# Patient Record
Sex: Female | Born: 1935 | Race: Black or African American | Hispanic: No | Smoking: Never smoker
Health system: Southern US, Community
[De-identification: ages and names within clinical notes are randomized; demographics above are authoritative.]

## PROBLEM LIST (undated history)

## (undated) DIAGNOSIS — F419 Anxiety disorder, unspecified: Secondary | ICD-10-CM

## (undated) DIAGNOSIS — N189 Chronic kidney disease, unspecified: Secondary | ICD-10-CM

## (undated) DIAGNOSIS — I10 Essential (primary) hypertension: Secondary | ICD-10-CM

## (undated) DIAGNOSIS — G4700 Insomnia, unspecified: Secondary | ICD-10-CM

## (undated) DIAGNOSIS — M1990 Unspecified osteoarthritis, unspecified site: Secondary | ICD-10-CM

## (undated) DIAGNOSIS — E7800 Pure hypercholesterolemia, unspecified: Secondary | ICD-10-CM

## (undated) DIAGNOSIS — G709 Myoneural disorder, unspecified: Secondary | ICD-10-CM

## (undated) DIAGNOSIS — K219 Gastro-esophageal reflux disease without esophagitis: Secondary | ICD-10-CM

## (undated) HISTORY — PX: ABDOMINAL HYSTERECTOMY: SHX81

---

## 2017-01-20 ENCOUNTER — Encounter (INDEPENDENT_AMBULATORY_CARE_PROVIDER_SITE_OTHER): Payer: Medicare Other | Admitting: Ophthalmology

## 2017-01-20 DIAGNOSIS — I10 Essential (primary) hypertension: Secondary | ICD-10-CM | POA: Diagnosis not present

## 2017-01-20 DIAGNOSIS — H35033 Hypertensive retinopathy, bilateral: Secondary | ICD-10-CM | POA: Diagnosis not present

## 2017-01-20 DIAGNOSIS — H353132 Nonexudative age-related macular degeneration, bilateral, intermediate dry stage: Secondary | ICD-10-CM

## 2017-01-20 DIAGNOSIS — H34832 Tributary (branch) retinal vein occlusion, left eye, with macular edema: Secondary | ICD-10-CM

## 2017-02-17 ENCOUNTER — Encounter (INDEPENDENT_AMBULATORY_CARE_PROVIDER_SITE_OTHER): Payer: Medicare Other | Admitting: Ophthalmology

## 2017-02-21 ENCOUNTER — Encounter (INDEPENDENT_AMBULATORY_CARE_PROVIDER_SITE_OTHER): Payer: Medicare Other | Admitting: Ophthalmology

## 2017-02-21 DIAGNOSIS — H353112 Nonexudative age-related macular degeneration, right eye, intermediate dry stage: Secondary | ICD-10-CM | POA: Diagnosis not present

## 2017-02-21 DIAGNOSIS — H353221 Exudative age-related macular degeneration, left eye, with active choroidal neovascularization: Secondary | ICD-10-CM

## 2017-02-21 DIAGNOSIS — I10 Essential (primary) hypertension: Secondary | ICD-10-CM | POA: Diagnosis not present

## 2017-02-21 DIAGNOSIS — H35033 Hypertensive retinopathy, bilateral: Secondary | ICD-10-CM

## 2017-02-21 DIAGNOSIS — H43813 Vitreous degeneration, bilateral: Secondary | ICD-10-CM | POA: Diagnosis not present

## 2017-03-21 ENCOUNTER — Encounter (INDEPENDENT_AMBULATORY_CARE_PROVIDER_SITE_OTHER): Payer: Medicare Other | Admitting: Ophthalmology

## 2017-03-21 DIAGNOSIS — H353221 Exudative age-related macular degeneration, left eye, with active choroidal neovascularization: Secondary | ICD-10-CM | POA: Diagnosis not present

## 2017-03-21 DIAGNOSIS — I10 Essential (primary) hypertension: Secondary | ICD-10-CM | POA: Diagnosis not present

## 2017-03-21 DIAGNOSIS — H353112 Nonexudative age-related macular degeneration, right eye, intermediate dry stage: Secondary | ICD-10-CM

## 2017-03-21 DIAGNOSIS — H35033 Hypertensive retinopathy, bilateral: Secondary | ICD-10-CM

## 2017-03-21 DIAGNOSIS — H34832 Tributary (branch) retinal vein occlusion, left eye, with macular edema: Secondary | ICD-10-CM | POA: Diagnosis not present

## 2017-03-21 DIAGNOSIS — H43813 Vitreous degeneration, bilateral: Secondary | ICD-10-CM

## 2017-04-17 ENCOUNTER — Encounter (INDEPENDENT_AMBULATORY_CARE_PROVIDER_SITE_OTHER): Payer: Medicare Other | Admitting: Ophthalmology

## 2017-04-17 DIAGNOSIS — H353221 Exudative age-related macular degeneration, left eye, with active choroidal neovascularization: Secondary | ICD-10-CM | POA: Diagnosis not present

## 2017-04-17 DIAGNOSIS — H43813 Vitreous degeneration, bilateral: Secondary | ICD-10-CM | POA: Diagnosis not present

## 2017-04-17 DIAGNOSIS — H34832 Tributary (branch) retinal vein occlusion, left eye, with macular edema: Secondary | ICD-10-CM | POA: Diagnosis not present

## 2017-04-17 DIAGNOSIS — H35033 Hypertensive retinopathy, bilateral: Secondary | ICD-10-CM | POA: Diagnosis not present

## 2017-04-17 DIAGNOSIS — H353112 Nonexudative age-related macular degeneration, right eye, intermediate dry stage: Secondary | ICD-10-CM

## 2017-04-17 DIAGNOSIS — I10 Essential (primary) hypertension: Secondary | ICD-10-CM

## 2017-05-29 ENCOUNTER — Encounter (INDEPENDENT_AMBULATORY_CARE_PROVIDER_SITE_OTHER): Payer: Medicare Other | Admitting: Ophthalmology

## 2017-05-29 DIAGNOSIS — H353111 Nonexudative age-related macular degeneration, right eye, early dry stage: Secondary | ICD-10-CM

## 2017-05-29 DIAGNOSIS — H353221 Exudative age-related macular degeneration, left eye, with active choroidal neovascularization: Secondary | ICD-10-CM | POA: Diagnosis not present

## 2017-05-29 DIAGNOSIS — H35033 Hypertensive retinopathy, bilateral: Secondary | ICD-10-CM

## 2017-05-29 DIAGNOSIS — I10 Essential (primary) hypertension: Secondary | ICD-10-CM | POA: Diagnosis not present

## 2017-05-29 DIAGNOSIS — H43813 Vitreous degeneration, bilateral: Secondary | ICD-10-CM | POA: Diagnosis not present

## 2017-07-17 ENCOUNTER — Encounter (INDEPENDENT_AMBULATORY_CARE_PROVIDER_SITE_OTHER): Payer: Medicare Other | Admitting: Ophthalmology

## 2017-07-17 DIAGNOSIS — I10 Essential (primary) hypertension: Secondary | ICD-10-CM | POA: Diagnosis not present

## 2017-07-17 DIAGNOSIS — H35033 Hypertensive retinopathy, bilateral: Secondary | ICD-10-CM | POA: Diagnosis not present

## 2017-07-17 DIAGNOSIS — H353221 Exudative age-related macular degeneration, left eye, with active choroidal neovascularization: Secondary | ICD-10-CM | POA: Diagnosis not present

## 2017-07-17 DIAGNOSIS — H43813 Vitreous degeneration, bilateral: Secondary | ICD-10-CM

## 2017-07-17 DIAGNOSIS — H353112 Nonexudative age-related macular degeneration, right eye, intermediate dry stage: Secondary | ICD-10-CM

## 2017-09-18 ENCOUNTER — Encounter (INDEPENDENT_AMBULATORY_CARE_PROVIDER_SITE_OTHER): Payer: Medicare Other | Admitting: Ophthalmology

## 2017-09-18 DIAGNOSIS — H35033 Hypertensive retinopathy, bilateral: Secondary | ICD-10-CM | POA: Diagnosis not present

## 2017-09-18 DIAGNOSIS — H43813 Vitreous degeneration, bilateral: Secondary | ICD-10-CM | POA: Diagnosis not present

## 2017-09-18 DIAGNOSIS — I10 Essential (primary) hypertension: Secondary | ICD-10-CM | POA: Diagnosis not present

## 2017-09-18 DIAGNOSIS — H353112 Nonexudative age-related macular degeneration, right eye, intermediate dry stage: Secondary | ICD-10-CM

## 2017-09-18 DIAGNOSIS — H353221 Exudative age-related macular degeneration, left eye, with active choroidal neovascularization: Secondary | ICD-10-CM

## 2017-11-12 ENCOUNTER — Other Ambulatory Visit: Payer: Self-pay | Admitting: Internal Medicine

## 2017-11-12 DIAGNOSIS — Z1231 Encounter for screening mammogram for malignant neoplasm of breast: Secondary | ICD-10-CM

## 2017-11-20 ENCOUNTER — Encounter (INDEPENDENT_AMBULATORY_CARE_PROVIDER_SITE_OTHER): Payer: Medicare Other | Admitting: Ophthalmology

## 2017-12-12 ENCOUNTER — Ambulatory Visit: Payer: Self-pay

## 2017-12-16 ENCOUNTER — Other Ambulatory Visit: Payer: Self-pay | Admitting: Gastroenterology

## 2017-12-18 ENCOUNTER — Encounter (INDEPENDENT_AMBULATORY_CARE_PROVIDER_SITE_OTHER): Payer: Medicare Other | Admitting: Ophthalmology

## 2017-12-18 DIAGNOSIS — H43813 Vitreous degeneration, bilateral: Secondary | ICD-10-CM

## 2017-12-18 DIAGNOSIS — H353221 Exudative age-related macular degeneration, left eye, with active choroidal neovascularization: Secondary | ICD-10-CM | POA: Diagnosis not present

## 2017-12-18 DIAGNOSIS — H35033 Hypertensive retinopathy, bilateral: Secondary | ICD-10-CM | POA: Diagnosis not present

## 2017-12-18 DIAGNOSIS — H353112 Nonexudative age-related macular degeneration, right eye, intermediate dry stage: Secondary | ICD-10-CM | POA: Diagnosis not present

## 2017-12-18 DIAGNOSIS — I10 Essential (primary) hypertension: Secondary | ICD-10-CM

## 2017-12-22 ENCOUNTER — Other Ambulatory Visit: Payer: Self-pay

## 2017-12-22 ENCOUNTER — Encounter (HOSPITAL_COMMUNITY): Payer: Self-pay | Admitting: Emergency Medicine

## 2017-12-30 ENCOUNTER — Other Ambulatory Visit (HOSPITAL_COMMUNITY): Payer: Self-pay | Admitting: Orthopedic Surgery

## 2017-12-30 DIAGNOSIS — Z96651 Presence of right artificial knee joint: Secondary | ICD-10-CM

## 2017-12-30 NOTE — H&P (Signed)
History of Present Illness  General:  84 year old female was referred for uncontrolled acid reflux. She has had acid reflux for over 10 years and reports an endoscopy performed in [place] in [place], was told she had irritation in the stomach. Currently she Dexilant 60 milligrams a day, carafate one gram BID and Zantac 75 milligrams at bedtime. Despite these medication she continues to have acid reflux throughout the day, which she describes as heartburn and reflux of food contents from the stomach into esophagus and mouth. She denies difficulty swallowing or pain on swallowing. She does however complain of early satiety and feeling full after 2 to 3 small bites. She is also lost about 10 pounds in the last one month. She feels more constipated than normal and has a bowel movement every 2 to 3 days. She will take fiber pills up to 8 at night as needed. She denies blood in stool or black stools. She reports having a colonoscopy performed in the last 10 years and tells me that it was normal. Patient drinks one cup of coffee a day, denies smoking, drinks red wine occasionally wonder 2 times a week. Patient denies use of NSAIDs. Patient denies eating late-night.   Current Medications  Taking   PreserVision AREDS - Tablet 2 tablets Orally once a day   Cod Liver Oil - Capsule 1 capsule Orally once a day   Centrum Silver - Tablet 1 tablet Orally once a day   Vitamin D3 1000 UNIT Tablet 1 tablet Orally Once a day   Vitamin B12 100 MCG Tablet 2 tablets Orally Once a day   Hydrocortisone 1 % Cream 1 application to affected area Rectal Twice a day   Metoprolol Succinate ER 50 MG Tablet Extended Release 24 Hour 1 tablet Orally Once a day   Potassium Chloride ER 10 MEQ Tablet Extended Release 1 tablet Orally Once a day   Triamterene-HCTZ 37.5-25 MG Capsule 1 capsule in the morning Orally Once a day   Sucralfate 1 GM Tablet 1 tablet at bedtime on an empty stomach before meals Orally Twice a day    Dexilant(Dexlansoprazole) 60 MG Capsule Delayed Release 1 capsule Orally Once a day   Zantac 75(RaNITidine HCl) 75 MG Tablet 1 tablet as needed Orally at bedtime   Temazepam 15 MG Capsule 1 capsule at bedtime as needed Orally Once a day   Rosuvastatin Calcium 10 MG Tablet 1 tablet Orally Once a day   Voltaren(Diclofenac Sodium (Ophth)) 1 % Gel apply sparringly Transdermal four times a day as needed   Medication List reviewed and reconciled with the patient    Past Medical History  Essential hypertension.   gastroesophageal reflux disease, status post EGD November 2011, mild antral gastritis, PUD 1989.   Anxiety, insomnia.   Cervical spinal stenosis.   osteoarthritis of the hand, compression of ulnar/median nerve Dr. Marlana LatusPilch.   dyslipidemia, patient has tried atorvastatin, latest rosuvastatin, TOTAL cholesterol greater than 300.   Opth- Dr. Ashley RoyaltyMatthews, Dentist Dr. Arby BarretteHatchett.   Osteopenia- BMD- 2018.   macular degeneration left eye- Avastin injections.    Surgical History  [facility] [facility]  [place], [place] Dr. Grayland JackMelanson 1999  left knee replacement surgery, [place] 2000  [facility] [place] [facility]  total abdominal hysterectomy with BIlateral salpingo-oophorectomy 1978  right breast biopsy, benign 04/2005   Family History  Father: deceased  Mother: deceased, diagnosed with Colon cancer  2 son(s) , 2 daughter(s) .  one child has diabetes Mother had colon cancer. Neg family hx of liver disease.   Social History  General:  Tobacco use  cigarettes: Never smoked Tobacco history last updated 11/12/2017 no EXPOSURE TO PASSIVE SMOKE.  no Alcohol.  no Caffeine.  no Recreational drug use.  DIET: good.  no Exercise.  DENTAL CARE: good.  Marital Status: single, live with daughter .  Children: girls, 2, Boys, 2.  EDUCATION: HS.  COMMUNICATION BARRIERS: hearing loss.     Allergies  Penicillin: rash - Allergy  Aspirin: rash - Allergy  Lisinopril: swelling of lips, rash inside of mouth - Allergy   Hospitalization/Major Diagnostic Procedure  None in the last year 12/2017   Review of Systems  GI PROCEDURE:  no Pacemaker/ AICD, no. no Artificial heart valves. no MI/heart attack. no Abnormal heart rhythm. no Angina. no CVA. Hypertension YES. no Hypotension. no Asthma, COPD. no Sleep apnea. no Seizure disorders. Artificial joints YES. Severe DJD YES. no Diabetes. no Significant headaches. no Vertigo. Depression/anxiety YES, anxiety. no Abnormal bleeding. no Kidney Disease. no Liver disease, no. no Chance of pregnancy. Blood transfusion yes. no Method of Birth Control. no Birth control pills.      Vital Signs  Wt 135.9, Wt change 3.7 lb, Ht 59.75, BMI 26.76, Temp 97.5, BP sitting 148/100.   Examination  Gastroenterology:: GENERAL APPEARANCE: frail appearing thinly built, appears hers stated age, no active distress,pleasant, no acute distress.  EYES: Lids and conjunctiva normal. Sclera normal, pupils equal and reactive .  ORAL CAVITY: Lips, teeth and gums are normal. Pharynx, tongue, mucosa normal .  SCLERA: anicteric .  NECK Full ROM, trachea midline, no thyromegaly or masses .  CARDIOVASCULAR PMI LS border. Normal RRR w/o murmers or gallops. No peripheral edema .  RESPIRATORY Breath sounds normal. Respiration even and unlabored .  ABDOMEN No masses palpated. Liver and spleen not palpated, normal. Bowel sounds normal, Abdomen not distended .  EXTREMITIES: No edema, pulses intact .  NEURO: normal strength and reflexes, cranial nerves II-XII grossly intact, normal gait .  PSYCH: mood/affect normal .     Assessments   1. Early satiety - R68.81 (Primary)   2. Epigastric pain - R10.13   3. Weight loss - R63.4   4. Chronic GERD - K21.9   Treatment  1. Early satiety  IMAGING: Esophagoscopy    Whitfield,Dia 12/16/2017 10:14:31 AM > spoke with  Kendall-scheduled for 01/01/18 at 8:30am at [facility]-prep instructions reviewed with pt.   Notes: Will evaluate further with an endoscopy. We take biopsies from the antrum to rule out H. pylori and small bowel to let celiac disease. Malignancy is a concern as patient also has unintentional weight loss of 10 pounds in the last one month.  Referral To: Reason:EGD w/propofol-spoke with Kendall-[facility]-#[id number]    2. Epigastric pain  IMAGING: Esophagoscopy    Whitfield,Dia 12/16/2017 10:14:31 AM > spoke with Kendall-scheduled for 01/01/18 at 8:30am at [facility]-prep instructions reviewed with pt.   Notes: Differential diagnosis includes H. pylori gastritis, peptic ulcer disease, biliary gastritis.    3. Weight loss  IMAGING: Esophagoscopy    Whitfield,Dia 12/16/2017 10:14:31 AM > spoke with Kendall-scheduled for 01/01/18 at 8:30am at [facility]-prep instructions reviewed with pt.   Notes: This is concerning and UGI malignancy needs to be ruled out. Patient also has uncontrolled acid reflux for over 10 years and Barrett's esophagus needs to be considered.    4. Chronic GERD  IMAGING: Esophagoscopy    Whitfield,Dia 12/16/2017 10:14:31 AM > spoke with Kendall-scheduled  for 01/01/18 at 8:30am at [facility]-prep instructions reviewed with pt.   Notes: Patient is on PPI /Dexilant and along with use of H2 antihistamine/Zantac at night, she also takes carafate twice a day. Patient does not eat late-night, I have however advised patient to avoid caffeinated products and to avoid or limit use of alcohol   Kerin Salen, MD

## 2017-12-31 NOTE — Anesthesia Preprocedure Evaluation (Addendum)
Anesthesia Evaluation  Patient identified by MRN, date of birth, ID band Patient awake    Reviewed: Allergy & Precautions, NPO status , Patient's Chart, lab work & pertinent test results, reviewed documented beta blocker date and time   Airway Mallampati: I  TM Distance: >3 FB Neck ROM: Full    Dental  (+) Dental Advisory Given, Edentulous Upper   Pulmonary neg pulmonary ROS, neg sleep apnea, neg COPD,    Pulmonary exam normal breath sounds clear to auscultation       Cardiovascular hypertension, Pt. on medications and Pt. on home beta blockers (-) Past MI Normal cardiovascular exam Rhythm:Regular Rate:Normal     Neuro/Psych Anxiety negative neurological ROS     GI/Hepatic Neg liver ROS, GERD  Medicated and Controlled,  Endo/Other  negative endocrine ROS  Renal/GU CRFRenal disease  negative genitourinary   Musculoskeletal  (+) Arthritis ,   Abdominal   Peds  Hematology negative hematology ROS (+)   Anesthesia Other Findings   Reproductive/Obstetrics                            Anesthesia Physical Anesthesia Plan  ASA: II  Anesthesia Plan: MAC   Post-op Pain Management:    Induction: Intravenous  PONV Risk Score and Plan: Propofol infusion and Treatment may vary due to age or medical condition  Airway Management Planned: Nasal Cannula  Additional Equipment: None  Intra-op Plan:   Post-operative Plan:   Informed Consent: I have reviewed the patients History and Physical, chart, labs and discussed the procedure including the risks, benefits and alternatives for the proposed anesthesia with the patient or authorized representative who has indicated his/her understanding and acceptance.   Dental advisory given  Plan Discussed with: CRNA  Anesthesia Plan Comments:         Anesthesia Quick Evaluation

## 2018-01-01 ENCOUNTER — Encounter (HOSPITAL_COMMUNITY)
Admission: RE | Disposition: A | Discharge status: Home / Self Care | Payer: Self-pay | Source: Ambulatory Visit | Attending: Gastroenterology

## 2018-01-01 ENCOUNTER — Ambulatory Visit (HOSPITAL_COMMUNITY): Payer: Medicare Other | Admitting: Certified Registered Nurse Anesthetist

## 2018-01-01 ENCOUNTER — Other Ambulatory Visit: Payer: Self-pay

## 2018-01-01 ENCOUNTER — Ambulatory Visit (HOSPITAL_COMMUNITY)
Admission: RE | Admit: 2018-01-01 | Discharge: 2018-01-01 | Disposition: A | Discharge status: Home / Self Care | Payer: Medicare Other | Source: Ambulatory Visit | Attending: Gastroenterology | Admitting: Gastroenterology

## 2018-01-01 ENCOUNTER — Encounter (HOSPITAL_COMMUNITY): Payer: Self-pay | Admitting: Certified Registered Nurse Anesthetist

## 2018-01-01 DIAGNOSIS — R6881 Early satiety: Secondary | ICD-10-CM | POA: Diagnosis not present

## 2018-01-01 DIAGNOSIS — M1990 Unspecified osteoarthritis, unspecified site: Secondary | ICD-10-CM | POA: Diagnosis not present

## 2018-01-01 DIAGNOSIS — F419 Anxiety disorder, unspecified: Secondary | ICD-10-CM | POA: Insufficient documentation

## 2018-01-01 DIAGNOSIS — Z8719 Personal history of other diseases of the digestive system: Secondary | ICD-10-CM | POA: Insufficient documentation

## 2018-01-01 DIAGNOSIS — R634 Abnormal weight loss: Secondary | ICD-10-CM | POA: Diagnosis not present

## 2018-01-01 DIAGNOSIS — N189 Chronic kidney disease, unspecified: Secondary | ICD-10-CM | POA: Insufficient documentation

## 2018-01-01 DIAGNOSIS — H3530 Unspecified macular degeneration: Secondary | ICD-10-CM | POA: Insufficient documentation

## 2018-01-01 DIAGNOSIS — Z96653 Presence of artificial knee joint, bilateral: Secondary | ICD-10-CM | POA: Diagnosis not present

## 2018-01-01 DIAGNOSIS — Z91040 Latex allergy status: Secondary | ICD-10-CM | POA: Insufficient documentation

## 2018-01-01 DIAGNOSIS — Z8711 Personal history of peptic ulcer disease: Secondary | ICD-10-CM | POA: Diagnosis not present

## 2018-01-01 DIAGNOSIS — R1013 Epigastric pain: Secondary | ICD-10-CM | POA: Diagnosis present

## 2018-01-01 DIAGNOSIS — Z888 Allergy status to other drugs, medicaments and biological substances status: Secondary | ICD-10-CM | POA: Insufficient documentation

## 2018-01-01 DIAGNOSIS — I129 Hypertensive chronic kidney disease with stage 1 through stage 4 chronic kidney disease, or unspecified chronic kidney disease: Secondary | ICD-10-CM | POA: Insufficient documentation

## 2018-01-01 DIAGNOSIS — Z886 Allergy status to analgesic agent status: Secondary | ICD-10-CM | POA: Insufficient documentation

## 2018-01-01 DIAGNOSIS — Z79899 Other long term (current) drug therapy: Secondary | ICD-10-CM | POA: Diagnosis not present

## 2018-01-01 DIAGNOSIS — E785 Hyperlipidemia, unspecified: Secondary | ICD-10-CM | POA: Insufficient documentation

## 2018-01-01 DIAGNOSIS — Z880 Allergy status to penicillin: Secondary | ICD-10-CM | POA: Insufficient documentation

## 2018-01-01 DIAGNOSIS — Z800 Family history of malignant neoplasm of digestive organs: Secondary | ICD-10-CM | POA: Diagnosis not present

## 2018-01-01 DIAGNOSIS — K5900 Constipation, unspecified: Secondary | ICD-10-CM | POA: Diagnosis not present

## 2018-01-01 DIAGNOSIS — G4700 Insomnia, unspecified: Secondary | ICD-10-CM | POA: Insufficient documentation

## 2018-01-01 DIAGNOSIS — M8580 Other specified disorders of bone density and structure, unspecified site: Secondary | ICD-10-CM | POA: Insufficient documentation

## 2018-01-01 DIAGNOSIS — K219 Gastro-esophageal reflux disease without esophagitis: Secondary | ICD-10-CM | POA: Diagnosis not present

## 2018-01-01 DIAGNOSIS — K2950 Unspecified chronic gastritis without bleeding: Secondary | ICD-10-CM | POA: Insufficient documentation

## 2018-01-01 DIAGNOSIS — Z96642 Presence of left artificial hip joint: Secondary | ICD-10-CM | POA: Diagnosis not present

## 2018-01-01 HISTORY — DX: Pure hypercholesterolemia, unspecified: E78.00

## 2018-01-01 HISTORY — PX: ESOPHAGOGASTRODUODENOSCOPY: SHX5428

## 2018-01-01 HISTORY — DX: Anxiety disorder, unspecified: F41.9

## 2018-01-01 HISTORY — DX: Myoneural disorder, unspecified: G70.9

## 2018-01-01 HISTORY — DX: Unspecified osteoarthritis, unspecified site: M19.90

## 2018-01-01 HISTORY — DX: Chronic kidney disease, unspecified: N18.9

## 2018-01-01 HISTORY — DX: Gastro-esophageal reflux disease without esophagitis: K21.9

## 2018-01-01 HISTORY — DX: Insomnia, unspecified: G47.00

## 2018-01-01 HISTORY — DX: Essential (primary) hypertension: I10

## 2018-01-01 SURGERY — EGD (ESOPHAGOGASTRODUODENOSCOPY)
Anesthesia: Monitor Anesthesia Care

## 2018-01-01 MED ORDER — METOPROLOL SUCCINATE ER 50 MG PO TB24
50.0000 mg | ORAL_TABLET | Freq: Every day | ORAL | Status: DC
  Administered 2018-01-01: 50 mg via ORAL
  Filled 2018-01-01: qty 1

## 2018-01-01 MED ORDER — SODIUM CHLORIDE 0.9 % IV SOLN: INTRAVENOUS | Status: DC

## 2018-01-01 MED ORDER — LACTATED RINGERS IV SOLN
INTRAVENOUS | Status: DC
Start: 2018-01-01 — End: 2018-01-01
  Administered 2018-01-01: 08:00:00 via INTRAVENOUS

## 2018-01-01 MED ORDER — PROPOFOL 500 MG/50ML IV EMUL
INTRAVENOUS | Status: DC | PRN
  Administered 2018-01-01: 75 ug/kg/min via INTRAVENOUS

## 2018-01-01 MED ORDER — PROPOFOL 10 MG/ML IV BOLUS
INTRAVENOUS | Status: AC
  Filled 2018-01-01: qty 40

## 2018-01-01 MED ORDER — PROPOFOL 10 MG/ML IV BOLUS
INTRAVENOUS | Status: DC | PRN
  Administered 2018-01-01 (×2): 20 mg via INTRAVENOUS

## 2018-01-01 NOTE — Anesthesia Procedure Notes (Signed)
Date/Time: 01/01/2018 8:20 AM Performed by: Vanessa Durhamochran, Florinda Taflinger Glenn, CRNA Oxygen Delivery Method: Nasal cannula

## 2018-01-01 NOTE — Discharge Instructions (Signed)
YOU HAD AN ENDOSCOPIC PROCEDURE TODAY: Refer to the procedure report and other information in the discharge instructions given to you for any specific questions about what was found during the examination. If this information does not answer your questions, please call [facility] office at [phone] to clarify.  ° °YOU SHOULD EXPECT: Some feelings of bloating in the abdomen. Passage of more gas than usual. Walking can help get rid of the air that was put into your GI tract during the procedure and reduce the bloating. ° °DIET: Your first meal following the procedure should be a light meal and then it is ok to progress to your normal diet. A half-sandwich or bowl of soup is an example of a good first meal. Heavy or fried foods are harder to digest and may make you feel nauseous or bloated. Drink plenty of fluids but you should avoid alcoholic beverages for 24 hours. If you had a esophageal dilation, please see attached instructions for diet.  ° °ACTIVITY: Your care partner should take you home directly after the procedure. You should plan to take it easy, moving slowly for the rest of the day. You can resume normal activity the day after the procedure however YOU SHOULD NOT DRIVE, use power tools, machinery or perform tasks that involve climbing or major physical exertion for 24 hours (because of the sedation medicines used during the test).  ° °SYMPTOMS TO REPORT IMMEDIATELY: °A gastroenterologist can be reached at any hour. Please call [phone]  for any of the following symptoms:  ° °Following upper endoscopy (EGD, EUS, ERCP, esophageal dilation) °Vomiting of blood or coffee ground material  °New, significant abdominal pain  °New, significant chest pain or pain under the shoulder blades  °Painful or persistently difficult swallowing  °New shortness of breath  °Black, tarry-looking or red, bloody stools ° °FOLLOW UP:  °If any biopsies were taken you will be contacted by phone or by letter within the next 1-3  weeks. Call [phone]  if you have not heard about the biopsies in 3 weeks.  °Please also call with any specific questions about appointments or follow up tests. ° °

## 2018-01-01 NOTE — Anesthesia Postprocedure Evaluation (Signed)
Anesthesia Post Note  Patient: Set designer  Procedure(s) Performed: ESOPHAGOGASTRODUODENOSCOPY (EGD) (N/A )     Patient location during evaluation: PACU Anesthesia Type: MAC Level of consciousness: awake and alert Pain management: pain level controlled Vital Signs Assessment: post-procedure vital signs reviewed and stable Respiratory status: spontaneous breathing, nonlabored ventilation and respiratory function stable Cardiovascular status: stable and blood pressure returned to baseline Anesthetic complications: no    Last Vitals:  Vitals:   01/01/18 0840 01/01/18 0855  BP: (!) 150/89 (!) 185/94  Pulse: 77 71  Resp: 19 16  Temp:    SpO2: 98% 98%    Last Pain:  Vitals:   01/01/18 0838  TempSrc: Oral                 Audry Pili

## 2018-01-01 NOTE — Brief Op Note (Signed)
01/01/2018  8:34 AM  PATIENT:  Lindsey Odom  84 y.o. female  PRE-OPERATIVE DIAGNOSIS:  Early satiety  POST-OPERATIVE DIAGNOSIS:  Normal EGD  PROCEDURE:  Procedure(s): ESOPHAGOGASTRODUODENOSCOPY (EGD) (N/A)  SURGEON:  Surgeon(s) and Role:    Ronnette Juniper, MD - Primary  PHYSICIAN ASSISTANT:   ASSISTANTS: Laverta Baltimore, RN, Aneta Mins, Tech ANESTHESIA:   MAC  EBL:  0 mL   BLOOD ADMINISTERED:none  DRAINS: none   LOCAL MEDICATIONS USED:  NONE  SPECIMEN:  Biopsy / Limited Resection  DISPOSITION OF SPECIMEN:  PATHOLOGY  COUNTS:  YES  TOURNIQUET:  * No tourniquets in log *  DICTATION: .Dragon Dictation  PLAN OF CARE: Discharge to home after PACU  PATIENT DISPOSITION:  Hemodynamically stable  Delay start of Pharmacological VTE agent (>24hrs) due to surgical blood loss or risk of bleeding: no

## 2018-01-01 NOTE — Op Note (Signed)
[facility] Patient Name: Lindsey Odom Procedure Date: 01/01/2018 MRN: [id number] Attending MD: Kerin Salen , MD Date of Birth: January 08, 1926 CSN: [id number] Age: 84 Admit Type: Inpatient Procedure:                Upper GI endoscopy Indications:              Epigastric abdominal pain, Suspected                            gastro-esophageal reflux disease(despite maximal                            antireflux therapy), Early satiety, Weight loss Providers:                Kerin Salen, MD, Omelia Blackwater RN, RN, Margo Aye, Technician Referring MD:              Medicines:                Monitored Anesthesia Care Complications:            No immediate complications. Estimated Blood Loss:     Estimated blood loss: none. Procedure:                Pre-Anesthesia Assessment:                           - Prior to the procedure, a History and Physical                            was performed, and patient medications and                            allergies were reviewed. The patient's tolerance of                            previous anesthesia was also reviewed. The risks                            and benefits of the procedure and the sedation                            options and risks were discussed with the patient.                            All questions were answered, and informed consent                            was obtained. Prior Anticoagulants: The patient has                            taken no previous anticoagulant or antiplatelet                            agents. ASA Grade Assessment:  II - A patient with                            mild systemic disease. After reviewing the risks                            and benefits, the patient was deemed in                            satisfactory condition to undergo the procedure.                           After obtaining informed consent, the endoscope was                            passed  under direct vision. Throughout the                            procedure, the patient's blood pressure, pulse, and                            oxygen saturations were monitored continuously. The                            EG-[id number] ([id number]) scope was introduced through the                            mouth, and advanced to the second part of duodenum.                            The upper GI endoscopy was accomplished without                            difficulty. The patient tolerated the procedure                            well. Scope In: Scope Out: Findings:      The examined esophagus was normal.      The Z-line was regular and was found 35 cm from the incisors.      Localized mildly erythematous mucosa without bleeding was found in the       gastric antrum. Biopsies were taken with a cold forceps for Helicobacter       pylori testing.      The cardia and gastric fundus were normal on retroflexion.      The examined duodenum was normal. Biopsies for histology were taken with       a cold forceps for evaluation of celiac disease. Impression:               - Normal esophagus.                           - Z-line regular, 35 cm from the incisors.                           -  Erythematous mucosa in the antrum. Biopsied.                           - Normal examined duodenum. Biopsied. Moderate Sedation:      Patient did not receive moderate sedation for this procedure, but       instead received monitored anesthesia care. Recommendation:           - Patient has a contact number available for                            emergencies. The signs and symptoms of potential                            delayed complications were discussed with the                            patient. Return to normal activities tomorrow.                            Written discharge instructions were provided to the                            patient.                           - Resume regular diet.                            - Continue present medications.                           - Await pathology results.                           - Aggressive anti reflux measures such as weight                            loss, elevate head end of the bed during sleep,                            avoid or limit caffeinated products and space last                            meal of the day and bedtime by at least 3 hours. Procedure Code(s):        --- Professional ---                           [phone], Esophagogastroduodenoscopy, flexible,                            transoral; with biopsy, single or multiple Diagnosis Code(s):        --- Professional ---                           K31.89, Other diseases of stomach and duodenum  R10.13, Epigastric pain                           R68.81, Early satiety                           R63.4, Abnormal weight loss CPT copyright 2016 American Medical Association. All rights reserved. The codes documented in this report are preliminary and upon coder review may  be revised to meet current compliance requirements. Kerin SalenArya Reace Breshears, MD 01/01/2018 8:33:58 AM This report has been signed electronically. Number of Addenda: 0

## 2018-01-01 NOTE — Op Note (Signed)
EGD was performed for early satiety, unintentional weight loss, epigastric pain and gastroesophageal reflux disease despite maximal medical therapy.  Esophagus appeared unremarkable. Z line was regular at 35 cm from insertion. Mild erythema was noted in the antrum, biopsies have been taken to rule out H. Pylori. Retroflexion was unremarkable. Duodenal bulb and rest of the duodenum appeared unremarkable, biopsies have been taken to rule out celiac disease.   Recommendation: Continue Dexilant 60 mg daily, Zantac 75 mg at night, and Carafate as prescribed. Advised to avoid caffeinated products ,avoid late-night meals and sleep with head end elevated at night. Follow up pathology as an outpatient.   Kerin SalenArya Beauty Pless, M.D.

## 2018-01-01 NOTE — Transfer of Care (Signed)
Immediate Anesthesia Transfer of Care Note  Patient: Lindsey Odom  Procedure(s) Performed: ESOPHAGOGASTRODUODENOSCOPY (EGD) (N/A )  Patient Location: PACU and endo unit  Anesthesia Type:MAC  Level of Consciousness: awake, alert , oriented and patient cooperative  Airway & Oxygen Therapy: Patient Spontanous Breathing and Patient connected to nasal cannula oxygen  Post-op Assessment: Report given to RN and Post -op Vital signs reviewed and stable  Post vital signs: Reviewed and stable  Last Vitals:  Vitals:   01/01/18 0742 01/01/18 0812  BP: (!) 196/107 (!) 191/100  Pulse: 95 89  Resp: 12   Temp:    SpO2: 99%     Last Pain:  Vitals:   01/01/18 0733  TempSrc: Oral         Complications: No apparent anesthesia complications

## 2018-01-01 NOTE — Interval H&P Note (Signed)
History and Physical Interval Note:  89/female with early satiety, weight loss, chronic GERD and epigastric pain despite maximal medical therapy for an EGD. 01/01/2018 8:18 AM  Lindsey Odom  has presented today for EGD, with the diagnosis of Early satiety  The various methods of treatment have been discussed with the patient and family. After consideration of risks, benefits and other options for treatment, the patient has consented to  Procedure(s): ESOPHAGOGASTRODUODENOSCOPY (EGD) (N/A) as a surgical intervention .  The patient's history has been reviewed, patient examined, no change in status, stable for surgery.  I have reviewed the patient's chart and labs.  Questions were answered to the patient's satisfaction.     Kerin SalenArya Shelby Peltz

## 2018-01-02 ENCOUNTER — Encounter (HOSPITAL_COMMUNITY): Payer: Self-pay | Admitting: Gastroenterology

## 2018-01-07 ENCOUNTER — Encounter (HOSPITAL_COMMUNITY): Payer: Medicare Other

## 2018-01-14 ENCOUNTER — Encounter (HOSPITAL_COMMUNITY): Payer: Self-pay

## 2018-01-14 ENCOUNTER — Encounter (HOSPITAL_COMMUNITY)
Admission: RE | Admit: 2018-01-14 | Discharge: 2018-01-14 | Disposition: A | Discharge status: Home / Self Care | Payer: Medicare Other | Source: Ambulatory Visit | Attending: Orthopedic Surgery | Admitting: Orthopedic Surgery

## 2018-01-14 DIAGNOSIS — Z96651 Presence of right artificial knee joint: Secondary | ICD-10-CM | POA: Diagnosis not present

## 2018-01-14 MED ORDER — TECHNETIUM TC 99M MEDRONATE IV KIT
25.0000 | PACK | Freq: Once | INTRAVENOUS | Status: AC | PRN
  Administered 2018-01-14: 20.5 via INTRAVENOUS

## 2018-02-05 ENCOUNTER — Encounter (INDEPENDENT_AMBULATORY_CARE_PROVIDER_SITE_OTHER): Payer: Medicare Other | Admitting: Ophthalmology

## 2018-02-05 DIAGNOSIS — H43813 Vitreous degeneration, bilateral: Secondary | ICD-10-CM

## 2018-02-05 DIAGNOSIS — H353112 Nonexudative age-related macular degeneration, right eye, intermediate dry stage: Secondary | ICD-10-CM

## 2018-02-05 DIAGNOSIS — H35033 Hypertensive retinopathy, bilateral: Secondary | ICD-10-CM

## 2018-02-05 DIAGNOSIS — H353221 Exudative age-related macular degeneration, left eye, with active choroidal neovascularization: Secondary | ICD-10-CM | POA: Diagnosis not present

## 2018-02-05 DIAGNOSIS — I10 Essential (primary) hypertension: Secondary | ICD-10-CM

## 2018-03-05 ENCOUNTER — Emergency Department (HOSPITAL_COMMUNITY): Payer: Medicare Other

## 2018-03-05 ENCOUNTER — Encounter (INDEPENDENT_AMBULATORY_CARE_PROVIDER_SITE_OTHER): Payer: Medicare Other | Admitting: Ophthalmology

## 2018-03-05 ENCOUNTER — Encounter (HOSPITAL_COMMUNITY): Payer: Self-pay

## 2018-03-05 ENCOUNTER — Other Ambulatory Visit: Payer: Self-pay

## 2018-03-05 ENCOUNTER — Emergency Department (HOSPITAL_COMMUNITY)
Admission: EM | Admit: 2018-03-05 | Discharge: 2018-03-06 | Disposition: A | Discharge status: Home / Self Care | Payer: Medicare Other | Attending: Emergency Medicine | Admitting: Emergency Medicine

## 2018-03-05 DIAGNOSIS — H43813 Vitreous degeneration, bilateral: Secondary | ICD-10-CM

## 2018-03-05 DIAGNOSIS — I10 Essential (primary) hypertension: Secondary | ICD-10-CM

## 2018-03-05 DIAGNOSIS — Z79899 Other long term (current) drug therapy: Secondary | ICD-10-CM | POA: Diagnosis not present

## 2018-03-05 DIAGNOSIS — E876 Hypokalemia: Secondary | ICD-10-CM | POA: Insufficient documentation

## 2018-03-05 DIAGNOSIS — R1900 Intra-abdominal and pelvic swelling, mass and lump, unspecified site: Secondary | ICD-10-CM | POA: Diagnosis not present

## 2018-03-05 DIAGNOSIS — I129 Hypertensive chronic kidney disease with stage 1 through stage 4 chronic kidney disease, or unspecified chronic kidney disease: Secondary | ICD-10-CM | POA: Diagnosis not present

## 2018-03-05 DIAGNOSIS — H35033 Hypertensive retinopathy, bilateral: Secondary | ICD-10-CM | POA: Diagnosis not present

## 2018-03-05 DIAGNOSIS — Z91040 Latex allergy status: Secondary | ICD-10-CM | POA: Diagnosis not present

## 2018-03-05 DIAGNOSIS — N183 Chronic kidney disease, stage 3 (moderate): Secondary | ICD-10-CM | POA: Diagnosis not present

## 2018-03-05 DIAGNOSIS — H353221 Exudative age-related macular degeneration, left eye, with active choroidal neovascularization: Secondary | ICD-10-CM | POA: Diagnosis not present

## 2018-03-05 DIAGNOSIS — H353112 Nonexudative age-related macular degeneration, right eye, intermediate dry stage: Secondary | ICD-10-CM

## 2018-03-05 DIAGNOSIS — R2243 Localized swelling, mass and lump, lower limb, bilateral: Secondary | ICD-10-CM | POA: Insufficient documentation

## 2018-03-05 DIAGNOSIS — R51 Headache: Secondary | ICD-10-CM | POA: Diagnosis present

## 2018-03-05 LAB — CBC WITH DIFFERENTIAL/PLATELET
Basophils Absolute: 0 10*3/uL (ref 0.0–0.1)
Basophils Relative: 0 %
Eosinophils Absolute: 0.1 10*3/uL (ref 0.0–0.7)
Eosinophils Relative: 2 %
HCT: 37.9 % (ref 36.0–46.0)
Hemoglobin: 11.6 g/dL — ABNORMAL LOW (ref 12.0–15.0)
Lymphocytes Relative: 31 %
Lymphs Abs: 1.2 10*3/uL (ref 0.7–4.0)
MCH: 27.2 pg (ref 26.0–34.0)
MCHC: 30.6 g/dL (ref 30.0–36.0)
MCV: 88.8 fL (ref 78.0–100.0)
Monocytes Absolute: 0.3 10*3/uL (ref 0.1–1.0)
Monocytes Relative: 8 %
Neutro Abs: 2.3 10*3/uL (ref 1.7–7.7)
Neutrophils Relative %: 59 %
Platelets: 127 10*3/uL — ABNORMAL LOW (ref 150–400)
RBC: 4.27 MIL/uL (ref 3.87–5.11)
RDW: 16.1 % — ABNORMAL HIGH (ref 11.5–15.5)
WBC: 3.9 10*3/uL — ABNORMAL LOW (ref 4.0–10.5)

## 2018-03-05 LAB — C-REACTIVE PROTEIN: CRP: <0.8 mg/dL (ref <1.0)

## 2018-03-05 LAB — BASIC METABOLIC PANEL
Anion gap: 12 (ref 5–15)
BUN: 16 mg/dL (ref 6–20)
CO2: 28 mmol/L (ref 22–32)
Calcium: 9.6 mg/dL (ref 8.9–10.3)
Chloride: 107 mmol/L (ref 101–111)
Creatinine, Ser: 0.75 mg/dL (ref 0.44–1.00)
GFR calc Af Amer: >60 mL/min (ref >60)
GFR calc non Af Amer: >60 mL/min (ref >60)
Glucose, Bld: 81 mg/dL (ref 65–99)
Potassium: 2.7 mmol/L — CL (ref 3.5–5.1)
Sodium: 147 mmol/L — ABNORMAL HIGH (ref 135–145)

## 2018-03-05 LAB — SEDIMENTATION RATE: Sed Rate: 14 mm/hr (ref 0–22)

## 2018-03-05 MED ORDER — ACETAMINOPHEN 500 MG PO TABS
1000.0000 mg | ORAL_TABLET | Freq: Once | ORAL | Status: AC
  Administered 2018-03-05: 1000 mg via ORAL
  Filled 2018-03-05: qty 2

## 2018-03-05 MED ORDER — MAGNESIUM OXIDE 400 (241.3 MG) MG PO TABS
800.0000 mg | ORAL_TABLET | Freq: Once | ORAL | Status: AC
  Administered 2018-03-05: 800 mg via ORAL
  Filled 2018-03-05: qty 2

## 2018-03-05 MED ORDER — POTASSIUM CHLORIDE CRYS ER 20 MEQ PO TBCR
40.0000 meq | EXTENDED_RELEASE_TABLET | Freq: Once | ORAL | Status: AC
  Administered 2018-03-05: 40 meq via ORAL
  Filled 2018-03-05: qty 2

## 2018-03-05 MED ORDER — POTASSIUM CHLORIDE 10 MEQ/100ML IV SOLN
10.0000 meq | Freq: Once | INTRAVENOUS | Status: AC
  Administered 2018-03-05: 10 meq via INTRAVENOUS
  Filled 2018-03-05: qty 100

## 2018-03-05 MED ORDER — IOPAMIDOL (ISOVUE-370) INJECTION 76%
100.0000 mL | Freq: Once | INTRAVENOUS | Status: AC | PRN
  Administered 2018-03-05: 100 mL via INTRAVENOUS

## 2018-03-05 MED ORDER — DIPHENHYDRAMINE HCL 50 MG/ML IJ SOLN
12.5000 mg | Freq: Once | INTRAMUSCULAR | Status: AC
Start: 2018-03-05 — End: 2018-03-05
  Administered 2018-03-05: 12.5 mg via INTRAVENOUS
  Filled 2018-03-05: qty 1

## 2018-03-05 MED ORDER — SODIUM CHLORIDE 0.9 % IV BOLUS
1000.0000 mL | Freq: Once | INTRAVENOUS | Status: AC
  Administered 2018-03-05: 1000 mL via INTRAVENOUS

## 2018-03-05 MED ORDER — PROCHLORPERAZINE EDISYLATE 5 MG/ML IJ SOLN
5.0000 mg | Freq: Once | INTRAMUSCULAR | Status: AC
  Administered 2018-03-05: 5 mg via INTRAVENOUS
  Filled 2018-03-05: qty 2

## 2018-03-05 MED ORDER — SODIUM CHLORIDE 0.9 % IJ SOLN
INTRAMUSCULAR | Status: AC
  Filled 2018-03-05: qty 50

## 2018-03-05 MED ORDER — IOPAMIDOL (ISOVUE-370) INJECTION 76%
INTRAVENOUS | Status: AC
  Filled 2018-03-05: qty 100

## 2018-03-05 NOTE — ED Notes (Signed)
Date and time results received: 03/05/18 1935 (use smartphrase ".now" to insert current time)  Test: Potassium Critical Value: 2.7  Name of Provider Notified: Adela LankFloyd  Orders Received? Or Actions Taken?: None at this time

## 2018-03-05 NOTE — Discharge Instructions (Signed)
Follow up with your PCP in the next few days.  Return for worsening symptoms.

## 2018-03-05 NOTE — ED Triage Notes (Signed)
Patient went to her eye doctor today and BP was elevated and was told to come to the ED. patient c/o headache.

## 2018-03-05 NOTE — ED Provider Notes (Signed)
[facility] DEPT Provider Note   CSN: [id number] Arrival date & time: 03/05/18  1549     History   Chief Complaint Chief Complaint  Patient presents with  . Hypertension    HPI Lindsey Odom is a 84 y.o. female.  84 yo F with a chief complaint of high blood pressure.  This been going on for quite some time.  The patient had a check today at the ophthalmologist office and was told that it was too high to get an injection and was sent here.  She did have an intensive eye exam and there was nothing that was mentioned to her about it being particular abnormal.  She has been having some left-sided headaches.  Nothing seems to make this better or worse.  Periorbital.  Not worse with eye movement.  Not worse with eating.  Denies fevers or chills denies trauma.  Denies unilateral numbness or weakness.  Denies neck pain.  Denies chest pain or shortness of breath.  Has had some chronic lower extremity edema which she thinks is mildly worse from baseline.  The history is provided by the patient.  Hypertension  This is a new problem. The current episode started yesterday. The problem occurs constantly. The problem has not changed since onset.Associated symptoms include headaches. Pertinent negatives include no chest pain and no shortness of breath. Nothing aggravates the symptoms. Nothing relieves the symptoms. She has tried nothing for the symptoms. The treatment provided no relief.    Past Medical History:  Diagnosis Date  . Anxiety   . Arthritis    osteoarthritis  . Chronic kidney disease    stage 3 kidney disease.   Marland Kitchen GERD (gastroesophageal reflux disease)   . Hypercholesterolemia   . Hypertension   . Insomnia   . 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   Past Surgical History:  Procedure Laterality Date  . ABDOMINAL HYSTERECTOMY    . ESOPHAGOGASTRODUODENOSCOPY  N/A 01/01/2018   Procedure: ESOPHAGOGASTRODUODENOSCOPY (EGD);  Surgeon: Ronnette Juniper, MD;  Location: [facility];  Service: Gastroenterology;  Laterality: N/A;     OB History   None      Home Medications    Prior to Admission medications   Medication Sig Start Date End Date Taking? Authorizing Provider  acetaminophen (TYLENOL) 500 MG tablet Take 500 mg by mouth every 6 (six) hours as needed for moderate pain or headache.   Yes [provider]  Cholecalciferol (VITAMIN D3) 1000 units CAPS Take 1,000 Units by mouth daily.   Yes [provider]  COD LIVER OIL PO Take 1 capsule by mouth daily.   Yes [provider]  Cyanocobalamin (VITAMIN B-12 PO) Take 2 tablets by mouth daily.   Yes [provider]  dexlansoprazole (DEXILANT) 60 MG capsule Take 60 mg by mouth daily.   Yes [provider]  hydrocortisone cream 1 % Apply 1 application topically 2 (two) times daily as needed for itching.   Yes [provider]  metoprolol succinate (TOPROL-XL) 50 MG 24 hr tablet Take 50 mg by mouth daily. Take with or immediately following a meal.   Yes [provider]  Multiple Vitamins-Minerals (CENTRUM SILVER PO) Take 1 tablet by mouth daily.   Yes [provider]  Multiple Vitamins-Minerals (PRESERVISION AREDS PO) Take 2 capsules by mouth daily.   Yes [provider]  rosuvastatin (CRESTOR) 10 MG tablet Take  10 mg by mouth daily.   Yes [provider]  sucralfate (CARAFATE) 1 g tablet Take 1 g by mouth at bedtime.   Yes [provider]    Family History History reviewed. No pertinent family history.  Social History Social History   Tobacco Use  . Smoking status: Never Smoker  . Smokeless tobacco: Never Used  Substance Use Topics  . Alcohol use: No    Frequency: Never  . Drug use: No     Allergies   Latex; Lisinopril; Aspirin; and Penicillins   Review of Systems Review of Systems    Constitutional: Negative for chills and fever.  HENT: Negative for congestion and rhinorrhea.   Eyes: Negative for redness and visual disturbance.  Respiratory: Negative for shortness of breath and wheezing.   Cardiovascular: Negative for chest pain and palpitations.  Gastrointestinal: Negative for nausea and vomiting.  Genitourinary: Negative for dysuria and urgency.  Musculoskeletal: Negative for arthralgias and myalgias.  Skin: Negative for pallor and wound.  Neurological: Positive for headaches. Negative for dizziness.     Physical Exam Updated Vital Signs BP (!) 166/96 (BP Location: Left Arm)   Pulse 82   Temp 97.9 F (36.6 C) (Oral)   Resp 18   Ht _0  (1.575 m)   Wt 61.2 kg (135 lb)   SpO2 100%   BMI 24.69 kg/m   Physical Exam  Constitutional: She is oriented to person, place, and time. She appears well-developed and well-nourished. No distress.  HENT:  Head: Normocephalic and atraumatic.  Eyes: Pupils are equal, round, and reactive to light. EOM are normal.  Pupils are dilated from the ophthalmologist office visit.  No noted papilledema to the right eye.  Difficult to visualize left eye due to her macular degeneration  Neck: Normal range of motion. Neck supple.  Cardiovascular: Normal rate and regular rhythm. Exam reveals no gallop and no friction rub.  No murmur heard. Pulmonary/Chest: Effort normal. She has no wheezes. She has no rales.  Abdominal: Soft. She exhibits no distension. There is no tenderness.  Musculoskeletal: She exhibits no edema or tenderness.  Neurological: She is alert and oriented to person, place, and time. She has normal strength. No cranial nerve deficit or sensory deficit. She displays a negative Romberg sign. Coordination and gait normal. GCS eye subscore is 4. GCS verbal subscore is 5. GCS motor subscore is 6.  Benign neuro exam  Skin: Skin is warm and dry. She is not diaphoretic.  Psychiatric: She has a normal mood and affect. Her  behavior is normal.  Nursing note and vitals reviewed.    ED Treatments / Results  Labs (all labs ordered are listed, but only abnormal results are displayed) Labs Reviewed  CBC WITH DIFFERENTIAL/PLATELET - Abnormal; Notable for the following components:      Result Value   WBC 3.9 (*)    Hemoglobin 11.6 (*)    RDW 16.1 (*)    Platelets 127 (*)    All other components within normal limits  BASIC METABOLIC PANEL - Abnormal; Notable for the following components:   Sodium 147 (*)    Potassium 2.7 (*)    All other components within normal limits  SEDIMENTATION RATE  C-REACTIVE PROTEIN    EKG None  Radiology Dg Chest 2 View  Result Date: 03/05/2018 CLINICAL DATA:  84 year old female with leg swelling. EXAM: CHEST - 2 VIEW COMPARISON:  None. FINDINGS: An area of increased density at the left lung base posteriorly, seen on the lateral  view, may represent atelectasis/scarring versus infiltrate. Clinical correlation is recommended. There is no pleural effusion or pneumothorax. Mild cardiomegaly. The thoracic aorta is mildly tortuous and appears somewhat dilated although evaluation is limited on this radiograph. The descending thoracic aorta measures approximately 5 cm in diameter. There is probable dilatation of the aortic root. CT may provide better evaluation if clinically indicated. There is osteopenia with degenerative changes of the spine and shoulders. No acute osseous pathology. IMPRESSION: 1. Left lung base focal atelectasis/scarring versus infiltrate. 2. Dilated appearance of the thoracic aorta. CT may provide better evaluation. Electronically Signed   By: Anner Crete M.D.   On: 03/05/2018 21:51   Ct Head Wo Contrast  Result Date: 03/05/2018 CLINICAL DATA:  Headache.  Hypertension. EXAM: CT HEAD WITHOUT CONTRAST TECHNIQUE: Contiguous axial images were obtained from the base of the skull through the vertex without intravenous contrast. COMPARISON:  None. FINDINGS: Brain:  Generalized age related atrophy. Advanced chronic small-vessel ischemic changes throughout the cerebral hemispheric white matter, pons, thalami and basal ganglia. No sign of acute infarction, mass lesion, hemorrhage, hydrocephalus or extra-axial collection. Vascular: There is atherosclerotic calcification of the major vessels at the base of the brain. Skull: Normal Sinuses/Orbits: Clear/normal Other: None IMPRESSION: No acute finding by CT. Age related atrophy. Advanced chronic small-vessel ischemic changes consistent with the history of hypertension. Electronically Signed   By: Nelson Chimes M.D.   On: 03/05/2018 19:14   Ct Angio Chest/abd/pel For Dissection W And/or Wo Contrast  Result Date: 03/05/2018 CLINICAL DATA:  Elevated blood pressure EXAM: CT ANGIOGRAPHY CHEST, ABDOMEN AND PELVIS TECHNIQUE: Multidetector CT imaging through the chest, abdomen and pelvis was performed using the standard protocol during bolus administration of intravenous contrast. Multiplanar reconstructed images and MIPs were obtained and reviewed to evaluate the vascular anatomy. CONTRAST:  13m ISOVUE-370 IOPAMIDOL (ISOVUE-370) INJECTION 76% COMPARISON:  None. FINDINGS: CTA CHEST FINDINGS Cardiovascular: Non contrasted images demonstrate no intramural hematoma. Moderate aortic atherosclerosis. No aneurysmal dilatation. No dissection. Mild cardiomegaly. No pericardial effusion Mediastinum/Nodes: Midline trachea. No thyroid mass. No significant adenopathy. Esophagus within normal limits. Lungs/Pleura: Atelectasis or scarring at the bases. Scattered lung blebs. No acute infiltrate or pneumothorax. Musculoskeletal: Ankylosis of the upper thoracic spine. Review of the MIP images confirms the above findings. CTA ABDOMEN AND PELVIS FINDINGS VASCULAR Aorta: Scattered atherosclerotic calcification. No aneurysm or dissection Celiac: Patent without evidence of aneurysm, dissection, vasculitis or significant stenosis. SMA: Patent without evidence  of aneurysm, dissection, vasculitis or significant stenosis. Renals: Single right and single left renal arteries are patent. IMA: Patent without evidence of aneurysm, dissection, vasculitis or significant stenosis. Inflow: Mild atherosclerotic disease. No hemodynamically significant stenosis. Review of the MIP images confirms the above findings. NON-VASCULAR Hepatobiliary: No focal liver abnormality is seen. No gallstones, gallbladder wall thickening, or biliary dilatation. Pancreas: Unremarkable. No pancreatic ductal dilatation or surrounding inflammatory changes. Spleen: Normal in size without focal abnormality. Adrenals/Urinary Tract: Adrenal glands are within normal limits. Mild scarring in the left kidney. Bladder unremarkable. Stomach/Bowel: Stomach is within normal limits. Appendix appears normal. No evidence of bowel wall thickening, distention, or inflammatory changes. Lymphatic: No significantly enlarged abdominal or pelvic lymph nodes. Reproductive: Status post hysterectomy. Other: Negative for free air or free fluid. Musculoskeletal: Bilateral hip replacements with associated artifact. Indeterminate 4.9 x 4.1 cm left anterior pelvic mass. Review of the MIP images confirms the above findings. IMPRESSION: 1. Negative for aortic dissection or aneurysm. 2. No significant vascular stenosis in the abdomen or pelvis 3. No CT evidence for  acute intra-abdominal or pelvic abnormality 4. 4.9 cm indeterminate soft tissue mass within the left anterior pelvis. Could attempt to further characterize with pelvic MRI, which could be performed on a short interim outpatient basis. Electronically Signed   By: Donavan Foil M.D.   On: 03/05/2018 23:21    Procedures Procedures (including critical care time) Procedure note: Ultrasound Guided Peripheral IV Ultrasound guided peripheral 1.88 inch angiocath IV placement performed by me. Indications: Nursing unable to place IV. Details: The antecubital fossa and upper arm were  evaluated with a multifrequency linear probe. Patent brachial veins were noted. 1 attempt was made to cannulate a vein under realtime US guidance with successful cannulation of the vein and catheter placement. There is return of non-pulsatile dark red blood. The patient tolerated the procedure well without complications. Images archived electronically.  CPT codes: [phone] and [phone]  Medications Ordered in ED Medications  sodium chloride 0.9 % injection (has no administration in time range)  iopamidol (ISOVUE-370) 76 % injection (has no administration in time range)  prochlorperazine (COMPAZINE) injection 5 mg (5 mg Intravenous Given 03/05/18 1822)  diphenhydrAMINE (BENADRYL) injection 12.5 mg (12.5 mg Intravenous Given 03/05/18 1821)  sodium chloride 0.9 % bolus 1,000 mL (0 mLs Intravenous Stopped 03/05/18 2012)  acetaminophen (TYLENOL) tablet 1,000 mg (1,000 mg Oral Given 03/05/18 1822)  potassium chloride 10 mEq in 100 mL IVPB (0 mEq Intravenous Stopped 03/05/18 2316)  potassium chloride SA (K-DUR,KLOR-CON) CR tablet 40 mEq (40 mEq Oral Given 03/05/18 2024)  magnesium oxide (MAG-OX) tablet 800 mg (800 mg Oral Given 03/05/18 2022)  iopamidol (ISOVUE-370) 76 % injection 100 mL (100 mLs Intravenous Contrast Given 03/05/18 2242)     Initial Impression / Assessment and Plan / ED Course  I have reviewed the triage vital signs and the nursing notes.  Pertinent labs & imaging results that were available during my care of the patient were reviewed by me and considered in my medical decision making (see chart for details).     84 yo F with a chief complaint of hypertension.  The patient has had an issue with this for a long time.  She is on medication for it but does not seem to help.  Went to her eye doctor today thought that it was too high to do an injection for macular degeneration and sent her here for evaluation.  The patient is also had some mild left-sided headaches.  This been going on for about a  week or so.  She has some pain about the temple.  I will evaluate the area for temporal arteritis with an ESR and CRP.  As this is a new headache for her will obtain a CT of the head.  CT of the head is normal.  The patient is markedly hypokalemic.  Her EKG has no changes consistent with this.  I will replenish at bedside.  Patient's blood pressure has improved with treatment of her headache.  This may be the cause of her elevated BP.  PCP follow up.   When I reentered the room the family was worried about her increased swelling to her legs.  This is apparently a chronic issue but worsening over the past couple days.  Chest x-ray was performed to evaluate for increased heart size or pulmonary edema.  Incidentally was noted that the patient may have a dilated proximal aorta.  Due to her hypertension and this finding a CT angiogram of the chest abdomen pelvis was performed.  This was negative for  dissection or aneurysm.  There was an incidental finding of a soft tissue mass in the left anterior pelvis which was discussed with the family.  Discharge home.  11:43 PM:  I have discussed the diagnosis/risks/treatment options with the patient and family and believe the pt to be eligible for discharge home to follow-up with PCP. We also discussed returning to the ED immediately if new or worsening sx occur. We discussed the sx which are most concerning (e.g., sudden worsening pain, fever, inability to tolerate by mouth) that necessitate immediate return. Medications administered to the patient during their visit and any new prescriptions provided to the patient are listed below.  Medications given during this visit Medications  sodium chloride 0.9 % injection (has no administration in time range)  iopamidol (ISOVUE-370) 76 % injection (has no administration in time range)  prochlorperazine (COMPAZINE) injection 5 mg (5 mg Intravenous Given 03/05/18 1822)  diphenhydrAMINE (BENADRYL) injection 12.5 mg (12.5 mg  Intravenous Given 03/05/18 1821)  sodium chloride 0.9 % bolus 1,000 mL (0 mLs Intravenous Stopped 03/05/18 2012)  acetaminophen (TYLENOL) tablet 1,000 mg (1,000 mg Oral Given 03/05/18 1822)  potassium chloride 10 mEq in 100 mL IVPB (0 mEq Intravenous Stopped 03/05/18 2316)  potassium chloride SA (K-DUR,KLOR-CON) CR tablet 40 mEq (40 mEq Oral Given 03/05/18 2024)  magnesium oxide (MAG-OX) tablet 800 mg (800 mg Oral Given 03/05/18 2022)  iopamidol (ISOVUE-370) 76 % injection 100 mL (100 mLs Intravenous Contrast Given 03/05/18 2242)     The patient appears reasonably screen and/or stabilized for discharge and I doubt any other medical condition or other [facility] requiring further screening, evaluation, or treatment in the ED at this time prior to discharge.      Final Clinical Impressions(s) / ED Diagnoses   Final diagnoses:  Essential hypertension  Hypokalemia    ED Discharge Orders    None       Deno Etienne, DO 03/05/18 2343

## 2018-03-05 NOTE — ED Notes (Signed)
Patient transported to CT 

## 2018-03-05 NOTE — ED Notes (Signed)
Delay in IV K+, pt's current IV is painful. MD Adela LankFloyd will be obtaining US IV

## 2018-03-05 NOTE — ED Notes (Signed)
Pt ambulatory to restroom

## 2018-03-06 ENCOUNTER — Other Ambulatory Visit: Payer: Self-pay | Admitting: Geriatric Medicine

## 2018-03-06 DIAGNOSIS — R1900 Intra-abdominal and pelvic swelling, mass and lump, unspecified site: Secondary | ICD-10-CM

## 2018-03-13 ENCOUNTER — Ambulatory Visit
Admission: RE | Admit: 2018-03-13 | Discharge: 2018-03-13 | Disposition: A | Discharge status: Home / Self Care | Payer: Medicare Other | Source: Ambulatory Visit | Attending: Geriatric Medicine | Admitting: Geriatric Medicine

## 2018-03-13 DIAGNOSIS — R1900 Intra-abdominal and pelvic swelling, mass and lump, unspecified site: Secondary | ICD-10-CM

## 2018-03-13 MED ORDER — GADOBENATE DIMEGLUMINE 529 MG/ML IV SOLN
11.0000 mL | Freq: Once | INTRAVENOUS | Status: AC | PRN
  Administered 2018-03-13: 11 mL via INTRAVENOUS

## 2018-03-16 ENCOUNTER — Other Ambulatory Visit (HOSPITAL_COMMUNITY): Payer: Self-pay | Admitting: Internal Medicine

## 2018-03-16 DIAGNOSIS — R591 Generalized enlarged lymph nodes: Secondary | ICD-10-CM

## 2018-03-24 ENCOUNTER — Other Ambulatory Visit: Payer: Self-pay | Admitting: Student

## 2018-03-25 ENCOUNTER — Ambulatory Visit (HOSPITAL_COMMUNITY)
Admission: RE | Admit: 2018-03-25 | Discharge: 2018-03-25 | Disposition: A | Discharge status: Home / Self Care | Payer: Medicare Other | Source: Ambulatory Visit | Attending: Internal Medicine | Admitting: Internal Medicine

## 2018-03-25 DIAGNOSIS — G4700 Insomnia, unspecified: Secondary | ICD-10-CM | POA: Insufficient documentation

## 2018-03-25 DIAGNOSIS — Z91040 Latex allergy status: Secondary | ICD-10-CM | POA: Insufficient documentation

## 2018-03-25 DIAGNOSIS — K219 Gastro-esophageal reflux disease without esophagitis: Secondary | ICD-10-CM | POA: Diagnosis not present

## 2018-03-25 DIAGNOSIS — Z888 Allergy status to other drugs, medicaments and biological substances status: Secondary | ICD-10-CM | POA: Insufficient documentation

## 2018-03-25 DIAGNOSIS — Z79899 Other long term (current) drug therapy: Secondary | ICD-10-CM | POA: Diagnosis not present

## 2018-03-25 DIAGNOSIS — I129 Hypertensive chronic kidney disease with stage 1 through stage 4 chronic kidney disease, or unspecified chronic kidney disease: Secondary | ICD-10-CM | POA: Diagnosis not present

## 2018-03-25 DIAGNOSIS — N183 Chronic kidney disease, stage 3 (moderate): Secondary | ICD-10-CM | POA: Diagnosis not present

## 2018-03-25 DIAGNOSIS — Z90710 Acquired absence of both cervix and uterus: Secondary | ICD-10-CM | POA: Insufficient documentation

## 2018-03-25 DIAGNOSIS — M1990 Unspecified osteoarthritis, unspecified site: Secondary | ICD-10-CM | POA: Diagnosis not present

## 2018-03-25 DIAGNOSIS — Z880 Allergy status to penicillin: Secondary | ICD-10-CM | POA: Insufficient documentation

## 2018-03-25 DIAGNOSIS — Z886 Allergy status to analgesic agent status: Secondary | ICD-10-CM | POA: Diagnosis not present

## 2018-03-25 DIAGNOSIS — E7800 Pure hypercholesterolemia, unspecified: Secondary | ICD-10-CM | POA: Diagnosis not present

## 2018-03-25 DIAGNOSIS — R591 Generalized enlarged lymph nodes: Secondary | ICD-10-CM

## 2018-03-25 DIAGNOSIS — R590 Localized enlarged lymph nodes: Secondary | ICD-10-CM | POA: Insufficient documentation

## 2018-03-25 MED ORDER — MIDAZOLAM HCL 2 MG/2ML IJ SOLN
INTRAMUSCULAR | Status: AC
  Filled 2018-03-25: qty 2

## 2018-03-25 MED ORDER — HYDROCODONE-ACETAMINOPHEN 5-325 MG PO TABS: 1.0000 | ORAL_TABLET | ORAL | Status: DC | PRN

## 2018-03-25 MED ORDER — FENTANYL CITRATE (PF) 100 MCG/2ML IJ SOLN
INTRAMUSCULAR | Status: AC
  Filled 2018-03-25: qty 2

## 2018-03-25 MED ORDER — FENTANYL CITRATE (PF) 100 MCG/2ML IJ SOLN
INTRAMUSCULAR | Status: AC | PRN
  Administered 2018-03-25: 25 ug via INTRAVENOUS
  Administered 2018-03-25: 50 ug via INTRAVENOUS

## 2018-03-25 MED ORDER — MIDAZOLAM HCL 2 MG/2ML IJ SOLN
INTRAMUSCULAR | Status: AC | PRN
  Administered 2018-03-25: 0.5 mg via INTRAVENOUS
  Administered 2018-03-25: 1 mg via INTRAVENOUS

## 2018-03-25 MED ORDER — LIDOCAINE HCL (PF) 1 % IJ SOLN
INTRAMUSCULAR | Status: AC
  Filled 2018-03-25: qty 30

## 2018-03-25 MED ORDER — SODIUM CHLORIDE 0.9 % IV SOLN: INTRAVENOUS | Status: DC

## 2018-03-25 NOTE — Sedation Documentation (Signed)
Patient is resting comfortably. 

## 2018-03-25 NOTE — H&P (Signed)
Chief Complaint: Patient was seen in consultation today for iliac LN bx at the request of Husain,Karrar  Referring Physician(s): Husain,Karrar  Supervising Physician: Oley Balm  Patient Status: [facility] - Out-pt  History of Present Illness: Lindsey Odom is a 84 y.o. female being worked up for enlarged left iliac LN of uncertain etiology. After review of her CT and MR, she is referred for image guided biopsy. PMHx, meds, labs, imaging, allergies reviewed. Feels well, no recent fevers, chills, illness. Has been NPO today as directed. Family at bedside.   Past Medical History:  Diagnosis Date  . Anxiety   . Arthritis    osteoarthritis  . Chronic kidney disease    stage 3 kidney disease.   Marland Kitchen GERD (gastroesophageal reflux disease)   . Hypercholesterolemia   . Hypertension   . Insomnia   . Neuromuscular disorder 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 ABDOMINAL HYSTERECTOMY    . ESOPHAGOGASTRODUODENOSCOPY N/A 01/01/2018   Procedure: ESOPHAGOGASTRODUODENOSCOPY (EGD);  Surgeon: Kerin Salen, MD;  Location: [facility];  Service: Gastroenterology;  Laterality: N/A;    Allergies: Latex; Lisinopril; Aspirin; and Penicillins  Medications: Prior to Admission medications   Medication Sig Start Date End Date Taking? Authorizing Provider  acetaminophen (TYLENOL) 500 MG tablet Take 500 mg by mouth every 6 (six) hours as needed for moderate pain or headache.    [provider]  Cholecalciferol (VITAMIN D3) 1000 units CAPS Take 1,000 Units by mouth daily.    [provider]  COD LIVER OIL PO Take 1 capsule by mouth daily.    [provider]  Cyanocobalamin (VITAMIN B-12 PO) Take 2 tablets by mouth daily.    [provider]  dexlansoprazole (DEXILANT) 60 MG capsule Take 60 mg by mouth daily.    [provider]  hydrocortisone cream 1 % Apply 1 application topically 2 (two)  times daily as needed for itching.    [provider]  metoprolol succinate (TOPROL-XL) 50 MG 24 hr tablet Take 50 mg by mouth daily. Take with or immediately following a meal.    [provider]  Multiple Vitamins-Minerals (CENTRUM SILVER PO) Take 1 tablet by mouth daily.    [provider]  Multiple Vitamins-Minerals (PRESERVISION AREDS PO) Take 2 capsules by mouth daily.    [provider]  rosuvastatin (CRESTOR) 10 MG tablet Take 10 mg by mouth daily.    [provider]  sucralfate (CARAFATE) 1 g tablet Take 1 g by mouth at bedtime.    [provider]     No family history on file.  Social History   Socioeconomic History  . Marital status: Widowed    Spouse name: Not on file  . Number of children: Not on file  . Years of education: Not on file  . Highest education level: Not on file  Occupational History  . Not on file  Social Needs  . Financial resource strain: Not on file  . Food insecurity:    Worry: Not on file    Inability: Not on file  . Transportation needs:    Medical: Not on file    Non-medical: Not on file  Tobacco Use  . Smoking status: Never Smoker  . Smokeless tobacco: Never Used  Substance and Sexual Activity  . Alcohol use: No    Frequency: Never  . Drug use: No  . Sexual activity: Not on file  Lifestyle  .  Physical activity:    Days per week: Not on file    Minutes per session: Not on file  . Stress: Not on file  Relationships  . Social connections:    Talks on phone: Not on file    Gets together: Not on file    Attends religious service: Not on file    Active member of club or organization: Not on file    Attends meetings of clubs or organizations: Not on file    Relationship status: Not on file  Other Topics Concern  . Not on file  Social History Narrative  . Not on file    Review of Systems: A 12 point ROS discussed and pertinent positives are indicated in the HPI above.  All other  systems are negative.  Review of Systems  Vital Signs: Pulse 87   Temp 98.2 F (36.8 C) (Oral)   Resp 16   Ht 5\' 2"  (1.575 m)   Wt 126 lb (57.2 kg)   SpO2 99%   BMI 23.05 kg/m   Physical Exam  Constitutional: She is oriented to person, place, and time. She appears well-developed. No distress.  HENT:  Head: Normocephalic.  Mouth/Throat: Oropharynx is clear and moist.  Cardiovascular: Normal rate, regular rhythm and normal heart sounds.  Pulmonary/Chest: Effort normal and breath sounds normal. No respiratory distress.  Abdominal: Soft. She exhibits no distension. There is no tenderness.  Neurological: She is alert and oriented to person, place, and time.  Skin: Skin is warm and dry.  Psychiatric: She has a normal mood and affect.     Imaging: Dg Chest 2 View  Result Date: 03/05/2018 CLINICAL DATA:  84 year old female with leg swelling. EXAM: CHEST - 2 VIEW COMPARISON:  None. FINDINGS: An area of increased density at the left lung base posteriorly, seen on the lateral view, may represent atelectasis/scarring versus infiltrate. Clinical correlation is recommended. There is no pleural effusion or pneumothorax. Mild cardiomegaly. The thoracic aorta is mildly tortuous and appears somewhat dilated although evaluation is limited on this radiograph. The descending thoracic aorta measures approximately 5 cm in diameter. There is probable dilatation of the aortic root. CT may provide better evaluation if clinically indicated. There is osteopenia with degenerative changes of the spine and shoulders. No acute osseous pathology. IMPRESSION: 1. Left lung base focal atelectasis/scarring versus infiltrate. 2. Dilated appearance of the thoracic aorta. CT may provide better evaluation. Electronically Signed   By: Elgie CollardArash  Radparvar M.D.   On: 03/05/2018 21:51   Ct Head Wo Contrast  Result Date: 03/05/2018 CLINICAL DATA:  Headache.  Hypertension. EXAM: CT HEAD WITHOUT CONTRAST TECHNIQUE: Contiguous  axial images were obtained from the base of the skull through the vertex without intravenous contrast. COMPARISON:  None. FINDINGS: Brain: Generalized age related atrophy. Advanced chronic small-vessel ischemic changes throughout the cerebral hemispheric white matter, pons, thalami and basal ganglia. No sign of acute infarction, mass lesion, hemorrhage, hydrocephalus or extra-axial collection. Vascular: There is atherosclerotic calcification of the major vessels at the base of the brain. Skull: Normal Sinuses/Orbits: Clear/normal Other: None IMPRESSION: No acute finding by CT. Age related atrophy. Advanced chronic small-vessel ischemic changes consistent with the history of hypertension. Electronically Signed   By: Paulina FusiMark  Shogry M.D.   On: 03/05/2018 19:14   Mr Pelvis W Wo Contrast  Result Date: 03/13/2018 CLINICAL DATA:  Left pelvic mass on recent CT. Creatinine was obtained on site at [facility] at [address] Results: Creatinine 0.8 mg/dL. EXAM: MRI PELVIS WITHOUT  AND WITH CONTRAST TECHNIQUE: Multiplanar multisequence MR imaging of the pelvis was performed both before and after administration of intravenous contrast. CONTRAST:  11mL MULTIHANCE GADOBENATE DIMEGLUMINE 529 MG/ML IV SOLN COMPARISON:  CT on 03/05/2018 FINDINGS: Image degradation by artifact from bilateral hip prostheses noted. Urinary Tract: No urinary bladder or urethral abnormality. Bowel: Unremarkable pelvic bowel loops. Vascular/Lymphatic: Unremarkable. A heterogeneous well-circumscribed mass is seen along the left external iliac lymph node chain which measures 6.0 x 4.4 by 3.1 cm. This is suspicious for lymphadenopathy. No other pathologically enlarged lymph nodes or soft tissue masses identified. Reproductive: -- Uterus: Surgically absent. Vaginal cuff is unremarkable in appearance. -- Right ovary: Not visualized, however no adnexal mass identified. -- Left ovary:  Not visualized, however no adnexal mass identified. Other: No  peritoneal thickening or abnormal free fluid. Musculoskeletal:  Unremarkable. IMPRESSION: Prior hysterectomy.  No adnexal mass identified. 6 cm well-circumscribed mass along the left external iliac lymph node chain, suspicious for lymphadenopathy. Consider percutaneous needle biopsy for tissue diagnosis. Electronically Signed   By: Myles Rosenthal M.D.   On: 03/13/2018 12:06   Ct Angio Chest/abd/pel For Dissection W And/or Wo Contrast  Result Date: 03/05/2018 CLINICAL DATA:  Elevated blood pressure EXAM: CT ANGIOGRAPHY CHEST, ABDOMEN AND PELVIS TECHNIQUE: Multidetector CT imaging through the chest, abdomen and pelvis was performed using the standard protocol during bolus administration of intravenous contrast. Multiplanar reconstructed images and MIPs were obtained and reviewed to evaluate the vascular anatomy. CONTRAST:  ISOVUE-370 IOPAMIDOL (ISOVUE-370) INJECTION 76% COMPARISON:  None. FINDINGS: CTA CHEST FINDINGS Cardiovascular: Non contrasted images demonstrate no intramural hematoma. Moderate aortic atherosclerosis. No aneurysmal dilatation. No dissection. Mild cardiomegaly. No pericardial effusion Mediastinum/Nodes: Midline trachea. No thyroid mass. No significant adenopathy. Esophagus within normal limits. Lungs/Pleura: Atelectasis or scarring at the bases. Scattered lung blebs. No acute infiltrate or pneumothorax. Musculoskeletal: Ankylosis of the upper thoracic spine. Review of the MIP images confirms the above findings. CTA ABDOMEN AND PELVIS FINDINGS VASCULAR Aorta: Scattered atherosclerotic calcification. No aneurysm or dissection Celiac: Patent without evidence of aneurysm, dissection, vasculitis or significant stenosis. SMA: Patent without evidence of aneurysm, dissection, vasculitis or significant stenosis. Renals: Single right and single left renal arteries are patent. IMA: Patent without evidence of aneurysm, dissection, vasculitis or significant stenosis. Inflow: Mild atherosclerotic disease.  No hemodynamically significant stenosis. Review of the MIP images confirms the above findings. NON-VASCULAR Hepatobiliary: No focal liver abnormality is seen. No gallstones, gallbladder wall thickening, or biliary dilatation. Pancreas: Unremarkable. No pancreatic ductal dilatation or surrounding inflammatory changes. Spleen: Normal in size without focal abnormality. Adrenals/Urinary Tract: Adrenal glands are within normal limits. Mild scarring in the left kidney. Bladder unremarkable. Stomach/Bowel: Stomach is within normal limits. Appendix appears normal. No evidence of bowel wall thickening, distention, or inflammatory changes. Lymphatic: No significantly enlarged abdominal or pelvic lymph nodes. Reproductive: Status post hysterectomy. Other: Negative for free air or free fluid. Musculoskeletal: Bilateral hip replacements with associated artifact. Indeterminate 4.9 x 4.1 cm left anterior pelvic mass. Review of the MIP images confirms the above findings. IMPRESSION: 1. Negative for aortic dissection or aneurysm. 2. No significant vascular stenosis in the abdomen or pelvis 3. No CT evidence for acute intra-abdominal or pelvic abnormality 4. 4.9 cm indeterminate soft tissue mass within the left anterior pelvis. Could attempt to further characterize with pelvic MRI, which could be performed on a short interim outpatient basis. Electronically Signed   By: Jasmine Pang M.D.   On: 03/05/2018 23:21    Labs:  CBC: Recent Labs  03/05/18 1850  WBC 3.9*  HGB 11.6*  HCT 37.9  PLT 127*    COAGS: No results for input(s): INR, APTT in the last 8760 hours.  BMP: Recent Labs    03/05/18 1850  NA 147*  K 2.7*  CL 107  CO2 28  GLUCOSE 81  BUN 16  CALCIUM 9.6  CREATININE 0.75  GFRNONAA >60  GFRAA >60    LIVER FUNCTION TESTS: No results for input(s): BILITOT, AST, ALT, ALKPHOS, PROT, ALBUMIN in the last 8760 hours.  TUMOR MARKERS: No results for input(s): AFPTM, CEA, CA199, CHROMGRNA in the last  8760 hours.  Assessment and Plan: Left external iliac lymphadenopathy For US guided biopsy Labs pending Risks and benefits discussed with the patient including, but not limited to bleeding, infection, damage to adjacent structures or low yield requiring additional tests.  All of the patient's questions were answered, patient is agreeable to proceed. Consent signed and in chart. '  Thank you for this interesting consult.  I greatly enjoyed meeting Ingris Zackery and look forward to participating in their care.  A copy of this report was sent to the requesting provider on this date.  Electronically Signed: Brayton El, PA-C 03/25/2018, 12:14 PM   I spent a total of 20 minutes in face to face in clinical consultation, greater than 50% of which was counseling/coordinating care for LN biopsy

## 2018-03-25 NOTE — Discharge Instructions (Addendum)
Moderate Conscious Sedation, Adult, Care After °These instructions provide you with information about caring for yourself after your procedure. Your health care provider may also give you more specific instructions. Your treatment has been planned according to current medical practices, but problems sometimes occur. Call your health care provider if you have any problems or questions after your procedure. °What can I expect after the procedure? °After your procedure, it is common: °· To feel sleepy for several hours. °· To feel clumsy and have poor balance for several hours. °· To have poor judgment for several hours. °· To vomit if you eat too soon. ° °Follow these instructions at home: °For at least 24 hours after the procedure: ° °· Do not: °? Participate in activities where you could fall or become injured. °? Drive. °? Use heavy machinery. °? Drink alcohol. °? Take sleeping pills or medicines that cause drowsiness. °? Make important decisions or sign legal documents. °? Take care of children on your own. °· Rest. °Eating and drinking °· Follow the diet recommended by your health care provider. °· If you vomit: °? Drink water, juice, or soup when you can drink without vomiting. °? Make sure you have little or no nausea before eating solid foods. °General instructions °· Have a responsible adult stay with you until you are awake and alert. °· Take over-the-counter and prescription medicines only as told by your health care provider. °· If you smoke, do not smoke without supervision. °· Keep all follow-up visits as told by your health care provider. This is important. °Contact a health care provider if: °· You keep feeling nauseous or you keep vomiting. °· You feel light-headed. °· You develop a rash. °· You have a fever. °Get help right away if: °· You have trouble breathing. °This information is not intended to replace advice given to you by your health care provider. Make sure you discuss any questions you have  with your health care provider. °Document Released: 09/15/2013 Document Revised: 04/29/2016 Document Reviewed: 03/16/2016 °Elsevier Interactive Patient Education © 2018 [employer] Inc[employer] ° ° °Needle Biopsy, Care After °These instructions give you information about caring for yourself after your procedure. Your doctor may also give you more specific instructions. Call your doctor if you have any problems or questions after your procedure. °Follow these instructions at home: °· Rest as told by your doctor. °· Take medicines only as told by your doctor. °· There are many different ways to close and cover the biopsy site, including stitches (sutures), skin glue, and adhesive strips. Follow instructions from your doctor about: °? How to take care of your biopsy site. °? When and how you should change your bandage (dressing). °? When you should remove your dressing. °? Removing whatever was used to close your biopsy site. °· Check your biopsy site every day for signs of infection. Watch for: °? Redness, swelling, or pain. °? Fluid, blood, or pus. °Contact a doctor if: °· You have a fever. °· You have redness, swelling, or pain at the biopsy site, and it lasts longer than a few days. °· You have fluid, blood, or pus coming from the biopsy site. °· You feel sick to your stomach (nauseous). °· You throw up (vomit). °Get help right away if: °· You are short of breath. °· You have trouble breathing. °· Your chest hurts. °· You feel dizzy or you pass out (faint). °· You have bleeding that does not stop with pressure or a bandage. °· You cough up blood. °·   Your belly (abdomen) hurts. °This information is not intended to replace advice given to you by your health care provider. Make sure you discuss any questions you have with your health care provider. °Document Released: 11/07/2008 Document Revised: 05/02/2016 Document Reviewed: 11/21/2014 °Elsevier Interactive Patient Education © 2018 Elsevier Inc. ° °

## 2018-03-25 NOTE — Procedures (Signed)
  Procedure: US core L ext iliac LAN 18g x6 in saline EBL:   minimal Complications:  none immediate  See full dictation in [facility] PACS.  Thora Lance. Dai Apel MD Main # [phone] Pager  [phone]

## 2018-03-25 NOTE — Progress Notes (Signed)
Clayborne DanaPatti, RN in Radiology aware unable to obtain IV or labs, sending for pt per her, will perform these tasks once pt arrives in Radiology dept.

## 2018-04-30 ENCOUNTER — Encounter (INDEPENDENT_AMBULATORY_CARE_PROVIDER_SITE_OTHER): Payer: Medicare Other | Admitting: Ophthalmology

## 2018-04-30 DIAGNOSIS — H35033 Hypertensive retinopathy, bilateral: Secondary | ICD-10-CM | POA: Diagnosis not present

## 2018-04-30 DIAGNOSIS — H353112 Nonexudative age-related macular degeneration, right eye, intermediate dry stage: Secondary | ICD-10-CM | POA: Diagnosis not present

## 2018-04-30 DIAGNOSIS — I10 Essential (primary) hypertension: Secondary | ICD-10-CM | POA: Diagnosis not present

## 2018-04-30 DIAGNOSIS — H43813 Vitreous degeneration, bilateral: Secondary | ICD-10-CM | POA: Diagnosis not present

## 2018-04-30 DIAGNOSIS — H353221 Exudative age-related macular degeneration, left eye, with active choroidal neovascularization: Secondary | ICD-10-CM | POA: Diagnosis not present

## 2018-05-28 ENCOUNTER — Encounter (INDEPENDENT_AMBULATORY_CARE_PROVIDER_SITE_OTHER): Payer: Medicare Other | Admitting: Ophthalmology

## 2018-05-28 DIAGNOSIS — H353221 Exudative age-related macular degeneration, left eye, with active choroidal neovascularization: Secondary | ICD-10-CM

## 2018-05-28 DIAGNOSIS — H35033 Hypertensive retinopathy, bilateral: Secondary | ICD-10-CM | POA: Diagnosis not present

## 2018-05-28 DIAGNOSIS — H353112 Nonexudative age-related macular degeneration, right eye, intermediate dry stage: Secondary | ICD-10-CM

## 2018-05-28 DIAGNOSIS — I10 Essential (primary) hypertension: Secondary | ICD-10-CM | POA: Diagnosis not present

## 2018-05-28 DIAGNOSIS — H43813 Vitreous degeneration, bilateral: Secondary | ICD-10-CM

## 2018-06-25 ENCOUNTER — Encounter (INDEPENDENT_AMBULATORY_CARE_PROVIDER_SITE_OTHER): Payer: Medicare Other | Admitting: Ophthalmology

## 2018-06-25 DIAGNOSIS — H353221 Exudative age-related macular degeneration, left eye, with active choroidal neovascularization: Secondary | ICD-10-CM | POA: Diagnosis not present

## 2018-06-25 DIAGNOSIS — H353112 Nonexudative age-related macular degeneration, right eye, intermediate dry stage: Secondary | ICD-10-CM

## 2018-06-25 DIAGNOSIS — H43813 Vitreous degeneration, bilateral: Secondary | ICD-10-CM

## 2018-06-25 DIAGNOSIS — I10 Essential (primary) hypertension: Secondary | ICD-10-CM

## 2018-06-25 DIAGNOSIS — H35033 Hypertensive retinopathy, bilateral: Secondary | ICD-10-CM | POA: Diagnosis not present

## 2018-07-23 ENCOUNTER — Encounter (INDEPENDENT_AMBULATORY_CARE_PROVIDER_SITE_OTHER): Payer: Medicare Other | Admitting: Ophthalmology

## 2018-07-23 DIAGNOSIS — H353112 Nonexudative age-related macular degeneration, right eye, intermediate dry stage: Secondary | ICD-10-CM | POA: Diagnosis not present

## 2018-07-23 DIAGNOSIS — H35033 Hypertensive retinopathy, bilateral: Secondary | ICD-10-CM

## 2018-07-23 DIAGNOSIS — I10 Essential (primary) hypertension: Secondary | ICD-10-CM

## 2018-07-23 DIAGNOSIS — H43813 Vitreous degeneration, bilateral: Secondary | ICD-10-CM

## 2018-07-23 DIAGNOSIS — H353221 Exudative age-related macular degeneration, left eye, with active choroidal neovascularization: Secondary | ICD-10-CM

## 2018-08-20 ENCOUNTER — Encounter (INDEPENDENT_AMBULATORY_CARE_PROVIDER_SITE_OTHER): Payer: Medicare Other | Admitting: Ophthalmology

## 2018-08-20 DIAGNOSIS — H353221 Exudative age-related macular degeneration, left eye, with active choroidal neovascularization: Secondary | ICD-10-CM | POA: Diagnosis not present

## 2018-08-20 DIAGNOSIS — I10 Essential (primary) hypertension: Secondary | ICD-10-CM | POA: Diagnosis not present

## 2018-08-20 DIAGNOSIS — H353112 Nonexudative age-related macular degeneration, right eye, intermediate dry stage: Secondary | ICD-10-CM

## 2018-08-20 DIAGNOSIS — H35033 Hypertensive retinopathy, bilateral: Secondary | ICD-10-CM

## 2018-08-20 DIAGNOSIS — H43813 Vitreous degeneration, bilateral: Secondary | ICD-10-CM

## 2018-09-16 ENCOUNTER — Encounter (INDEPENDENT_AMBULATORY_CARE_PROVIDER_SITE_OTHER): Payer: Medicare Other | Admitting: Ophthalmology

## 2018-09-16 DIAGNOSIS — H353112 Nonexudative age-related macular degeneration, right eye, intermediate dry stage: Secondary | ICD-10-CM

## 2018-09-16 DIAGNOSIS — H35033 Hypertensive retinopathy, bilateral: Secondary | ICD-10-CM

## 2018-09-16 DIAGNOSIS — H353221 Exudative age-related macular degeneration, left eye, with active choroidal neovascularization: Secondary | ICD-10-CM

## 2018-09-16 DIAGNOSIS — H43813 Vitreous degeneration, bilateral: Secondary | ICD-10-CM

## 2018-09-16 DIAGNOSIS — I10 Essential (primary) hypertension: Secondary | ICD-10-CM | POA: Diagnosis not present

## 2018-10-14 ENCOUNTER — Encounter (INDEPENDENT_AMBULATORY_CARE_PROVIDER_SITE_OTHER): Payer: Medicare Other | Admitting: Ophthalmology

## 2018-10-14 DIAGNOSIS — H35033 Hypertensive retinopathy, bilateral: Secondary | ICD-10-CM | POA: Diagnosis not present

## 2018-10-14 DIAGNOSIS — H353112 Nonexudative age-related macular degeneration, right eye, intermediate dry stage: Secondary | ICD-10-CM | POA: Diagnosis not present

## 2018-10-14 DIAGNOSIS — I10 Essential (primary) hypertension: Secondary | ICD-10-CM | POA: Diagnosis not present

## 2018-10-14 DIAGNOSIS — H353221 Exudative age-related macular degeneration, left eye, with active choroidal neovascularization: Secondary | ICD-10-CM | POA: Diagnosis not present

## 2018-10-14 DIAGNOSIS — H43813 Vitreous degeneration, bilateral: Secondary | ICD-10-CM

## 2018-11-13 ENCOUNTER — Encounter (INDEPENDENT_AMBULATORY_CARE_PROVIDER_SITE_OTHER): Payer: Medicare Other | Admitting: Ophthalmology

## 2018-11-13 DIAGNOSIS — H353221 Exudative age-related macular degeneration, left eye, with active choroidal neovascularization: Secondary | ICD-10-CM | POA: Diagnosis not present

## 2018-11-13 DIAGNOSIS — I10 Essential (primary) hypertension: Secondary | ICD-10-CM

## 2018-11-13 DIAGNOSIS — H35033 Hypertensive retinopathy, bilateral: Secondary | ICD-10-CM

## 2018-11-13 DIAGNOSIS — H43813 Vitreous degeneration, bilateral: Secondary | ICD-10-CM

## 2018-11-13 DIAGNOSIS — H353112 Nonexudative age-related macular degeneration, right eye, intermediate dry stage: Secondary | ICD-10-CM

## 2018-12-11 ENCOUNTER — Encounter (INDEPENDENT_AMBULATORY_CARE_PROVIDER_SITE_OTHER): Payer: Medicare Other | Admitting: Ophthalmology

## 2018-12-18 ENCOUNTER — Encounter (INDEPENDENT_AMBULATORY_CARE_PROVIDER_SITE_OTHER): Payer: Medicare Other | Admitting: Ophthalmology

## 2018-12-18 DIAGNOSIS — H353221 Exudative age-related macular degeneration, left eye, with active choroidal neovascularization: Secondary | ICD-10-CM

## 2018-12-18 DIAGNOSIS — H35033 Hypertensive retinopathy, bilateral: Secondary | ICD-10-CM | POA: Diagnosis not present

## 2018-12-18 DIAGNOSIS — H353112 Nonexudative age-related macular degeneration, right eye, intermediate dry stage: Secondary | ICD-10-CM

## 2018-12-18 DIAGNOSIS — H43813 Vitreous degeneration, bilateral: Secondary | ICD-10-CM

## 2018-12-18 DIAGNOSIS — I10 Essential (primary) hypertension: Secondary | ICD-10-CM | POA: Diagnosis not present

## 2019-01-15 ENCOUNTER — Encounter (INDEPENDENT_AMBULATORY_CARE_PROVIDER_SITE_OTHER): Payer: Medicare Other | Admitting: Ophthalmology

## 2019-01-15 DIAGNOSIS — H35033 Hypertensive retinopathy, bilateral: Secondary | ICD-10-CM

## 2019-01-15 DIAGNOSIS — I10 Essential (primary) hypertension: Secondary | ICD-10-CM

## 2019-01-15 DIAGNOSIS — H353112 Nonexudative age-related macular degeneration, right eye, intermediate dry stage: Secondary | ICD-10-CM

## 2019-01-15 DIAGNOSIS — H353221 Exudative age-related macular degeneration, left eye, with active choroidal neovascularization: Secondary | ICD-10-CM

## 2019-01-15 DIAGNOSIS — H43813 Vitreous degeneration, bilateral: Secondary | ICD-10-CM

## 2019-02-11 ENCOUNTER — Encounter (INDEPENDENT_AMBULATORY_CARE_PROVIDER_SITE_OTHER): Payer: Medicare Other | Admitting: Ophthalmology

## 2019-02-15 ENCOUNTER — Encounter (INDEPENDENT_AMBULATORY_CARE_PROVIDER_SITE_OTHER): Payer: Medicare Other | Admitting: Ophthalmology

## 2019-02-15 DIAGNOSIS — H35033 Hypertensive retinopathy, bilateral: Secondary | ICD-10-CM | POA: Diagnosis not present

## 2019-02-15 DIAGNOSIS — I10 Essential (primary) hypertension: Secondary | ICD-10-CM | POA: Diagnosis not present

## 2019-02-15 DIAGNOSIS — H353221 Exudative age-related macular degeneration, left eye, with active choroidal neovascularization: Secondary | ICD-10-CM

## 2019-02-15 DIAGNOSIS — H43813 Vitreous degeneration, bilateral: Secondary | ICD-10-CM

## 2019-02-15 DIAGNOSIS — H353112 Nonexudative age-related macular degeneration, right eye, intermediate dry stage: Secondary | ICD-10-CM

## 2019-03-22 ENCOUNTER — Encounter (INDEPENDENT_AMBULATORY_CARE_PROVIDER_SITE_OTHER): Payer: Medicare Other | Admitting: Ophthalmology

## 2019-06-09 ENCOUNTER — Encounter (INDEPENDENT_AMBULATORY_CARE_PROVIDER_SITE_OTHER): Payer: Medicare Other | Admitting: Ophthalmology

## 2019-06-09 ENCOUNTER — Other Ambulatory Visit: Payer: Self-pay

## 2019-06-09 DIAGNOSIS — H353112 Nonexudative age-related macular degeneration, right eye, intermediate dry stage: Secondary | ICD-10-CM

## 2019-06-09 DIAGNOSIS — I10 Essential (primary) hypertension: Secondary | ICD-10-CM

## 2019-06-09 DIAGNOSIS — H353221 Exudative age-related macular degeneration, left eye, with active choroidal neovascularization: Secondary | ICD-10-CM

## 2019-06-09 DIAGNOSIS — H35033 Hypertensive retinopathy, bilateral: Secondary | ICD-10-CM | POA: Diagnosis not present

## 2019-06-09 DIAGNOSIS — H43813 Vitreous degeneration, bilateral: Secondary | ICD-10-CM

## 2019-07-02 ENCOUNTER — Encounter (INDEPENDENT_AMBULATORY_CARE_PROVIDER_SITE_OTHER): Payer: Medicare Other | Admitting: Ophthalmology

## 2019-07-02 ENCOUNTER — Other Ambulatory Visit: Payer: Self-pay

## 2019-07-02 DIAGNOSIS — H43813 Vitreous degeneration, bilateral: Secondary | ICD-10-CM

## 2019-07-02 DIAGNOSIS — I10 Essential (primary) hypertension: Secondary | ICD-10-CM | POA: Diagnosis not present

## 2019-07-02 DIAGNOSIS — H353221 Exudative age-related macular degeneration, left eye, with active choroidal neovascularization: Secondary | ICD-10-CM | POA: Diagnosis not present

## 2019-07-02 DIAGNOSIS — H353112 Nonexudative age-related macular degeneration, right eye, intermediate dry stage: Secondary | ICD-10-CM | POA: Diagnosis not present

## 2019-07-02 DIAGNOSIS — H35033 Hypertensive retinopathy, bilateral: Secondary | ICD-10-CM

## 2019-07-28 ENCOUNTER — Encounter (INDEPENDENT_AMBULATORY_CARE_PROVIDER_SITE_OTHER): Payer: Medicare Other | Admitting: Ophthalmology

## 2019-07-28 ENCOUNTER — Other Ambulatory Visit: Payer: Self-pay

## 2019-07-28 DIAGNOSIS — H353231 Exudative age-related macular degeneration, bilateral, with active choroidal neovascularization: Secondary | ICD-10-CM | POA: Diagnosis not present

## 2019-07-28 DIAGNOSIS — H43813 Vitreous degeneration, bilateral: Secondary | ICD-10-CM | POA: Diagnosis not present

## 2019-07-28 DIAGNOSIS — H353112 Nonexudative age-related macular degeneration, right eye, intermediate dry stage: Secondary | ICD-10-CM | POA: Diagnosis not present

## 2019-07-28 DIAGNOSIS — I10 Essential (primary) hypertension: Secondary | ICD-10-CM | POA: Diagnosis not present

## 2019-07-28 DIAGNOSIS — H35033 Hypertensive retinopathy, bilateral: Secondary | ICD-10-CM | POA: Diagnosis not present

## 2019-08-02 ENCOUNTER — Encounter (INDEPENDENT_AMBULATORY_CARE_PROVIDER_SITE_OTHER): Payer: Medicare Other | Admitting: Ophthalmology

## 2019-08-25 ENCOUNTER — Encounter (INDEPENDENT_AMBULATORY_CARE_PROVIDER_SITE_OTHER): Payer: Medicare Other | Admitting: Ophthalmology

## 2019-08-25 ENCOUNTER — Other Ambulatory Visit: Payer: Self-pay

## 2019-08-25 DIAGNOSIS — I10 Essential (primary) hypertension: Secondary | ICD-10-CM

## 2019-08-25 DIAGNOSIS — H353112 Nonexudative age-related macular degeneration, right eye, intermediate dry stage: Secondary | ICD-10-CM | POA: Diagnosis not present

## 2019-08-25 DIAGNOSIS — H35033 Hypertensive retinopathy, bilateral: Secondary | ICD-10-CM | POA: Diagnosis not present

## 2019-08-25 DIAGNOSIS — H353221 Exudative age-related macular degeneration, left eye, with active choroidal neovascularization: Secondary | ICD-10-CM | POA: Diagnosis not present

## 2019-08-25 DIAGNOSIS — H43813 Vitreous degeneration, bilateral: Secondary | ICD-10-CM

## 2019-09-20 ENCOUNTER — Other Ambulatory Visit: Payer: Self-pay

## 2019-09-20 ENCOUNTER — Encounter (INDEPENDENT_AMBULATORY_CARE_PROVIDER_SITE_OTHER): Payer: Medicare Other | Admitting: Ophthalmology

## 2019-09-20 DIAGNOSIS — H353112 Nonexudative age-related macular degeneration, right eye, intermediate dry stage: Secondary | ICD-10-CM | POA: Diagnosis not present

## 2019-09-20 DIAGNOSIS — I10 Essential (primary) hypertension: Secondary | ICD-10-CM | POA: Diagnosis not present

## 2019-09-20 DIAGNOSIS — H353221 Exudative age-related macular degeneration, left eye, with active choroidal neovascularization: Secondary | ICD-10-CM | POA: Diagnosis not present

## 2019-09-20 DIAGNOSIS — H35033 Hypertensive retinopathy, bilateral: Secondary | ICD-10-CM

## 2019-09-20 DIAGNOSIS — H43813 Vitreous degeneration, bilateral: Secondary | ICD-10-CM

## 2019-09-22 ENCOUNTER — Encounter (INDEPENDENT_AMBULATORY_CARE_PROVIDER_SITE_OTHER): Payer: Medicare Other | Admitting: Ophthalmology

## 2019-10-18 ENCOUNTER — Encounter (INDEPENDENT_AMBULATORY_CARE_PROVIDER_SITE_OTHER): Payer: Medicare Other | Admitting: Ophthalmology

## 2019-10-18 DIAGNOSIS — I10 Essential (primary) hypertension: Secondary | ICD-10-CM | POA: Diagnosis not present

## 2019-10-18 DIAGNOSIS — H35033 Hypertensive retinopathy, bilateral: Secondary | ICD-10-CM

## 2019-10-18 DIAGNOSIS — H353221 Exudative age-related macular degeneration, left eye, with active choroidal neovascularization: Secondary | ICD-10-CM | POA: Diagnosis not present

## 2019-10-18 DIAGNOSIS — H43813 Vitreous degeneration, bilateral: Secondary | ICD-10-CM

## 2019-10-18 DIAGNOSIS — H353112 Nonexudative age-related macular degeneration, right eye, intermediate dry stage: Secondary | ICD-10-CM

## 2019-11-15 ENCOUNTER — Encounter (INDEPENDENT_AMBULATORY_CARE_PROVIDER_SITE_OTHER): Payer: Medicare Other | Admitting: Ophthalmology

## 2019-11-15 DIAGNOSIS — H353221 Exudative age-related macular degeneration, left eye, with active choroidal neovascularization: Secondary | ICD-10-CM

## 2019-11-15 DIAGNOSIS — H353112 Nonexudative age-related macular degeneration, right eye, intermediate dry stage: Secondary | ICD-10-CM

## 2019-11-15 DIAGNOSIS — H43813 Vitreous degeneration, bilateral: Secondary | ICD-10-CM

## 2019-11-15 DIAGNOSIS — I10 Essential (primary) hypertension: Secondary | ICD-10-CM

## 2019-11-15 DIAGNOSIS — H35033 Hypertensive retinopathy, bilateral: Secondary | ICD-10-CM | POA: Diagnosis not present

## 2019-12-13 ENCOUNTER — Other Ambulatory Visit: Payer: Self-pay

## 2019-12-13 ENCOUNTER — Encounter (INDEPENDENT_AMBULATORY_CARE_PROVIDER_SITE_OTHER): Payer: Medicare Other | Admitting: Ophthalmology

## 2019-12-13 DIAGNOSIS — H35033 Hypertensive retinopathy, bilateral: Secondary | ICD-10-CM

## 2019-12-13 DIAGNOSIS — H353112 Nonexudative age-related macular degeneration, right eye, intermediate dry stage: Secondary | ICD-10-CM

## 2019-12-13 DIAGNOSIS — H43813 Vitreous degeneration, bilateral: Secondary | ICD-10-CM

## 2019-12-13 DIAGNOSIS — I10 Essential (primary) hypertension: Secondary | ICD-10-CM | POA: Diagnosis not present

## 2019-12-13 DIAGNOSIS — H353221 Exudative age-related macular degeneration, left eye, with active choroidal neovascularization: Secondary | ICD-10-CM | POA: Diagnosis not present

## 2020-01-05 ENCOUNTER — Ambulatory Visit: Payer: Medicare Other

## 2020-01-16 ENCOUNTER — Ambulatory Visit: Payer: Medicare Other | Attending: Internal Medicine

## 2020-01-16 ENCOUNTER — Ambulatory Visit: Payer: Medicare Other

## 2020-01-16 DIAGNOSIS — Z23 Encounter for immunization: Secondary | ICD-10-CM | POA: Insufficient documentation

## 2020-01-16 NOTE — Progress Notes (Signed)
   Covid-19 Vaccination Clinic  Name:  Imogine Carvell    MRN: [id number] DOB: [birth year]  01/16/2020  Ms. Belen was observed post Covid-19 immunization for 15 minutes without incidence. She was provided with Vaccine Information Sheet and instruction to access the V-Safe system.   Ms. Olvey was instructed to call 911 with any severe reactions post vaccine: Marland Kitchen Difficulty breathing  . Swelling of your face and throat  . A fast heartbeat  . A bad rash all over your body  . Dizziness and weakness    Immunizations Administered    Name Date Dose VIS Date Route   Pfizer COVID-19 Vaccine 01/16/2020 12:23 PM 0.3 mL 11/19/2019 Intramuscular   Manufacturer: [employer], [employer]   Lot: [id number]   NDC: [address]-6

## 2020-01-17 ENCOUNTER — Encounter (INDEPENDENT_AMBULATORY_CARE_PROVIDER_SITE_OTHER): Payer: Medicare Other | Admitting: Ophthalmology

## 2020-01-18 ENCOUNTER — Encounter (INDEPENDENT_AMBULATORY_CARE_PROVIDER_SITE_OTHER): Payer: Medicare Other | Admitting: Ophthalmology

## 2020-01-18 DIAGNOSIS — H353112 Nonexudative age-related macular degeneration, right eye, intermediate dry stage: Secondary | ICD-10-CM | POA: Diagnosis not present

## 2020-01-18 DIAGNOSIS — H353221 Exudative age-related macular degeneration, left eye, with active choroidal neovascularization: Secondary | ICD-10-CM | POA: Diagnosis not present

## 2020-01-18 DIAGNOSIS — I10 Essential (primary) hypertension: Secondary | ICD-10-CM | POA: Diagnosis not present

## 2020-01-18 DIAGNOSIS — H35033 Hypertensive retinopathy, bilateral: Secondary | ICD-10-CM

## 2020-01-18 DIAGNOSIS — H43813 Vitreous degeneration, bilateral: Secondary | ICD-10-CM

## 2020-02-08 ENCOUNTER — Ambulatory Visit: Payer: Medicare Other | Attending: Internal Medicine

## 2020-02-08 DIAGNOSIS — Z23 Encounter for immunization: Secondary | ICD-10-CM

## 2020-02-08 NOTE — Progress Notes (Signed)
   Covid-19 Vaccination Clinic  Name:  Lindsey Odom    MRN: [id number] DOB: December 05, 1926  02/08/2020  [name] was observed post Covid-19 immunization for 15 minutes without incident. She was provided with Vaccine Information Sheet and instruction to access the V-Safe system.   [name] was instructed to call 911 with any severe reactions post vaccine: Marland Kitchen Difficulty breathing  . Swelling of face and throat  . A fast heartbeat  . A bad rash all over body  . Dizziness and weakness   Immunizations Administered    Name Date Dose VIS Date Route   Pfizer COVID-19 Vaccine 02/08/2020  8:49 AM 0.3 mL 11/19/2019 Intramuscular   Manufacturer: [employer], [employer]   Lot: [id number]   NDC: [address]-2

## 2020-02-22 ENCOUNTER — Encounter (INDEPENDENT_AMBULATORY_CARE_PROVIDER_SITE_OTHER): Payer: Medicare Other | Admitting: Ophthalmology

## 2020-02-22 ENCOUNTER — Other Ambulatory Visit: Payer: Self-pay

## 2020-02-22 DIAGNOSIS — H43813 Vitreous degeneration, bilateral: Secondary | ICD-10-CM

## 2020-02-22 DIAGNOSIS — H353112 Nonexudative age-related macular degeneration, right eye, intermediate dry stage: Secondary | ICD-10-CM

## 2020-02-22 DIAGNOSIS — H35033 Hypertensive retinopathy, bilateral: Secondary | ICD-10-CM | POA: Diagnosis not present

## 2020-02-22 DIAGNOSIS — H353221 Exudative age-related macular degeneration, left eye, with active choroidal neovascularization: Secondary | ICD-10-CM | POA: Diagnosis not present

## 2020-02-22 DIAGNOSIS — I10 Essential (primary) hypertension: Secondary | ICD-10-CM

## 2020-03-28 ENCOUNTER — Encounter (INDEPENDENT_AMBULATORY_CARE_PROVIDER_SITE_OTHER): Payer: Medicare Other | Admitting: Ophthalmology

## 2020-03-28 ENCOUNTER — Other Ambulatory Visit: Payer: Self-pay

## 2020-03-28 DIAGNOSIS — I10 Essential (primary) hypertension: Secondary | ICD-10-CM | POA: Diagnosis not present

## 2020-03-28 DIAGNOSIS — H43813 Vitreous degeneration, bilateral: Secondary | ICD-10-CM

## 2020-03-28 DIAGNOSIS — H353112 Nonexudative age-related macular degeneration, right eye, intermediate dry stage: Secondary | ICD-10-CM | POA: Diagnosis not present

## 2020-03-28 DIAGNOSIS — H353221 Exudative age-related macular degeneration, left eye, with active choroidal neovascularization: Secondary | ICD-10-CM | POA: Diagnosis not present

## 2020-03-28 DIAGNOSIS — H35033 Hypertensive retinopathy, bilateral: Secondary | ICD-10-CM | POA: Diagnosis not present

## 2020-05-09 ENCOUNTER — Other Ambulatory Visit: Payer: Self-pay

## 2020-05-09 ENCOUNTER — Encounter (INDEPENDENT_AMBULATORY_CARE_PROVIDER_SITE_OTHER): Payer: Medicare Other | Admitting: Ophthalmology

## 2020-05-09 DIAGNOSIS — H35033 Hypertensive retinopathy, bilateral: Secondary | ICD-10-CM

## 2020-05-09 DIAGNOSIS — H43813 Vitreous degeneration, bilateral: Secondary | ICD-10-CM

## 2020-05-09 DIAGNOSIS — I10 Essential (primary) hypertension: Secondary | ICD-10-CM

## 2020-05-09 DIAGNOSIS — H353221 Exudative age-related macular degeneration, left eye, with active choroidal neovascularization: Secondary | ICD-10-CM | POA: Diagnosis not present

## 2020-05-09 DIAGNOSIS — H353112 Nonexudative age-related macular degeneration, right eye, intermediate dry stage: Secondary | ICD-10-CM

## 2020-06-26 ENCOUNTER — Other Ambulatory Visit: Payer: Self-pay

## 2020-06-26 ENCOUNTER — Encounter (INDEPENDENT_AMBULATORY_CARE_PROVIDER_SITE_OTHER): Payer: Medicare Other | Admitting: Ophthalmology

## 2020-06-26 DIAGNOSIS — H35033 Hypertensive retinopathy, bilateral: Secondary | ICD-10-CM | POA: Diagnosis not present

## 2020-06-26 DIAGNOSIS — H353112 Nonexudative age-related macular degeneration, right eye, intermediate dry stage: Secondary | ICD-10-CM | POA: Diagnosis not present

## 2020-06-26 DIAGNOSIS — H43813 Vitreous degeneration, bilateral: Secondary | ICD-10-CM

## 2020-06-26 DIAGNOSIS — I10 Essential (primary) hypertension: Secondary | ICD-10-CM | POA: Diagnosis not present

## 2020-06-26 DIAGNOSIS — H353221 Exudative age-related macular degeneration, left eye, with active choroidal neovascularization: Secondary | ICD-10-CM

## 2020-08-22 ENCOUNTER — Encounter (INDEPENDENT_AMBULATORY_CARE_PROVIDER_SITE_OTHER): Payer: Medicare Other | Admitting: Ophthalmology

## 2020-08-22 ENCOUNTER — Other Ambulatory Visit: Payer: Self-pay

## 2020-08-22 DIAGNOSIS — H353112 Nonexudative age-related macular degeneration, right eye, intermediate dry stage: Secondary | ICD-10-CM

## 2020-08-22 DIAGNOSIS — H353221 Exudative age-related macular degeneration, left eye, with active choroidal neovascularization: Secondary | ICD-10-CM

## 2020-08-22 DIAGNOSIS — I10 Essential (primary) hypertension: Secondary | ICD-10-CM

## 2020-08-22 DIAGNOSIS — H35033 Hypertensive retinopathy, bilateral: Secondary | ICD-10-CM | POA: Diagnosis not present

## 2020-08-22 DIAGNOSIS — H43813 Vitreous degeneration, bilateral: Secondary | ICD-10-CM

## 2020-10-10 ENCOUNTER — Encounter (INDEPENDENT_AMBULATORY_CARE_PROVIDER_SITE_OTHER): Payer: Medicare Other | Admitting: Ophthalmology

## 2020-10-10 ENCOUNTER — Other Ambulatory Visit: Payer: Self-pay

## 2020-10-10 DIAGNOSIS — I10 Essential (primary) hypertension: Secondary | ICD-10-CM | POA: Diagnosis not present

## 2020-10-10 DIAGNOSIS — H43813 Vitreous degeneration, bilateral: Secondary | ICD-10-CM

## 2020-10-10 DIAGNOSIS — H353221 Exudative age-related macular degeneration, left eye, with active choroidal neovascularization: Secondary | ICD-10-CM | POA: Diagnosis not present

## 2020-10-10 DIAGNOSIS — H35033 Hypertensive retinopathy, bilateral: Secondary | ICD-10-CM | POA: Diagnosis not present

## 2020-10-10 DIAGNOSIS — H353112 Nonexudative age-related macular degeneration, right eye, intermediate dry stage: Secondary | ICD-10-CM | POA: Diagnosis not present

## 2020-11-21 ENCOUNTER — Other Ambulatory Visit: Payer: Self-pay

## 2020-11-21 ENCOUNTER — Encounter (INDEPENDENT_AMBULATORY_CARE_PROVIDER_SITE_OTHER): Payer: Medicare Other | Admitting: Ophthalmology

## 2020-11-21 DIAGNOSIS — H35033 Hypertensive retinopathy, bilateral: Secondary | ICD-10-CM

## 2020-11-21 DIAGNOSIS — H353112 Nonexudative age-related macular degeneration, right eye, intermediate dry stage: Secondary | ICD-10-CM

## 2020-11-21 DIAGNOSIS — H353221 Exudative age-related macular degeneration, left eye, with active choroidal neovascularization: Secondary | ICD-10-CM

## 2020-11-21 DIAGNOSIS — H43813 Vitreous degeneration, bilateral: Secondary | ICD-10-CM

## 2020-11-21 DIAGNOSIS — I10 Essential (primary) hypertension: Secondary | ICD-10-CM | POA: Diagnosis not present

## 2020-12-18 DIAGNOSIS — N183 Chronic kidney disease, stage 3 unspecified: Secondary | ICD-10-CM | POA: Diagnosis not present

## 2020-12-18 DIAGNOSIS — I10 Essential (primary) hypertension: Secondary | ICD-10-CM | POA: Diagnosis not present

## 2020-12-18 DIAGNOSIS — E7800 Pure hypercholesterolemia, unspecified: Secondary | ICD-10-CM | POA: Diagnosis not present

## 2020-12-18 DIAGNOSIS — Z23 Encounter for immunization: Secondary | ICD-10-CM | POA: Diagnosis not present

## 2020-12-18 DIAGNOSIS — M1990 Unspecified osteoarthritis, unspecified site: Secondary | ICD-10-CM | POA: Diagnosis not present

## 2020-12-18 DIAGNOSIS — Z1389 Encounter for screening for other disorder: Secondary | ICD-10-CM | POA: Diagnosis not present

## 2020-12-18 DIAGNOSIS — Z0000 Encounter for general adult medical examination without abnormal findings: Secondary | ICD-10-CM | POA: Diagnosis not present

## 2020-12-18 DIAGNOSIS — F5101 Primary insomnia: Secondary | ICD-10-CM | POA: Diagnosis not present

## 2020-12-18 DIAGNOSIS — K219 Gastro-esophageal reflux disease without esophagitis: Secondary | ICD-10-CM | POA: Diagnosis not present

## 2020-12-29 DIAGNOSIS — I10 Essential (primary) hypertension: Secondary | ICD-10-CM | POA: Diagnosis not present

## 2020-12-29 DIAGNOSIS — K219 Gastro-esophageal reflux disease without esophagitis: Secondary | ICD-10-CM | POA: Diagnosis not present

## 2020-12-29 DIAGNOSIS — M1990 Unspecified osteoarthritis, unspecified site: Secondary | ICD-10-CM | POA: Diagnosis not present

## 2020-12-29 DIAGNOSIS — E785 Hyperlipidemia, unspecified: Secondary | ICD-10-CM | POA: Diagnosis not present

## 2020-12-29 DIAGNOSIS — N183 Chronic kidney disease, stage 3 unspecified: Secondary | ICD-10-CM | POA: Diagnosis not present

## 2020-12-29 DIAGNOSIS — N182 Chronic kidney disease, stage 2 (mild): Secondary | ICD-10-CM | POA: Diagnosis not present

## 2020-12-29 DIAGNOSIS — M179 Osteoarthritis of knee, unspecified: Secondary | ICD-10-CM | POA: Diagnosis not present

## 2020-12-29 DIAGNOSIS — E7800 Pure hypercholesterolemia, unspecified: Secondary | ICD-10-CM | POA: Diagnosis not present

## 2021-01-02 ENCOUNTER — Encounter (INDEPENDENT_AMBULATORY_CARE_PROVIDER_SITE_OTHER): Payer: Medicare Other | Admitting: Ophthalmology

## 2021-01-02 DIAGNOSIS — R112 Nausea with vomiting, unspecified: Secondary | ICD-10-CM | POA: Diagnosis not present

## 2021-01-02 DIAGNOSIS — K219 Gastro-esophageal reflux disease without esophagitis: Secondary | ICD-10-CM | POA: Diagnosis not present

## 2021-01-02 DIAGNOSIS — R1013 Epigastric pain: Secondary | ICD-10-CM | POA: Diagnosis not present

## 2021-01-03 ENCOUNTER — Other Ambulatory Visit: Payer: Self-pay | Admitting: Physician Assistant

## 2021-01-03 ENCOUNTER — Ambulatory Visit
Admission: RE | Admit: 2021-01-03 | Discharge: 2021-01-03 | Disposition: A | Discharge status: Home / Self Care | Payer: Medicare Other | Source: Ambulatory Visit | Attending: Physician Assistant | Admitting: Physician Assistant

## 2021-01-03 ENCOUNTER — Encounter (INDEPENDENT_AMBULATORY_CARE_PROVIDER_SITE_OTHER): Payer: Medicare Other | Admitting: Ophthalmology

## 2021-01-03 DIAGNOSIS — N281 Cyst of kidney, acquired: Secondary | ICD-10-CM | POA: Diagnosis not present

## 2021-01-03 DIAGNOSIS — I700 Atherosclerosis of aorta: Secondary | ICD-10-CM | POA: Diagnosis not present

## 2021-01-03 DIAGNOSIS — R1907 Generalized intra-abdominal and pelvic swelling, mass and lump: Secondary | ICD-10-CM | POA: Diagnosis not present

## 2021-01-03 DIAGNOSIS — R112 Nausea with vomiting, unspecified: Secondary | ICD-10-CM

## 2021-01-03 DIAGNOSIS — R1110 Vomiting, unspecified: Secondary | ICD-10-CM | POA: Diagnosis not present

## 2021-01-03 MED ORDER — IOPAMIDOL (ISOVUE-300) INJECTION 61%
100.0000 mL | Freq: Once | INTRAVENOUS | Status: AC | PRN
  Administered 2021-01-03: 100 mL via INTRAVENOUS

## 2021-01-09 ENCOUNTER — Encounter (INDEPENDENT_AMBULATORY_CARE_PROVIDER_SITE_OTHER): Payer: Medicare Other | Admitting: Ophthalmology

## 2021-01-09 ENCOUNTER — Other Ambulatory Visit: Payer: Self-pay

## 2021-01-09 DIAGNOSIS — H43813 Vitreous degeneration, bilateral: Secondary | ICD-10-CM | POA: Diagnosis not present

## 2021-01-09 DIAGNOSIS — H353221 Exudative age-related macular degeneration, left eye, with active choroidal neovascularization: Secondary | ICD-10-CM

## 2021-01-09 DIAGNOSIS — H353112 Nonexudative age-related macular degeneration, right eye, intermediate dry stage: Secondary | ICD-10-CM

## 2021-01-09 DIAGNOSIS — I10 Essential (primary) hypertension: Secondary | ICD-10-CM

## 2021-01-09 DIAGNOSIS — H35033 Hypertensive retinopathy, bilateral: Secondary | ICD-10-CM | POA: Diagnosis not present

## 2021-01-10 DIAGNOSIS — M1712 Unilateral primary osteoarthritis, left knee: Secondary | ICD-10-CM | POA: Diagnosis not present

## 2021-01-11 ENCOUNTER — Other Ambulatory Visit (HOSPITAL_COMMUNITY): Payer: Self-pay | Admitting: Physician Assistant

## 2021-01-11 DIAGNOSIS — R112 Nausea with vomiting, unspecified: Secondary | ICD-10-CM

## 2021-01-17 DIAGNOSIS — M1712 Unilateral primary osteoarthritis, left knee: Secondary | ICD-10-CM | POA: Diagnosis not present

## 2021-01-24 ENCOUNTER — Other Ambulatory Visit: Payer: Self-pay | Admitting: Orthopaedic Surgery

## 2021-01-24 DIAGNOSIS — M1712 Unilateral primary osteoarthritis, left knee: Secondary | ICD-10-CM | POA: Diagnosis not present

## 2021-01-26 ENCOUNTER — Other Ambulatory Visit: Payer: Self-pay | Admitting: Orthopaedic Surgery

## 2021-01-26 DIAGNOSIS — M25561 Pain in right knee: Secondary | ICD-10-CM

## 2021-01-30 ENCOUNTER — Ambulatory Visit (HOSPITAL_COMMUNITY)
Admission: RE | Admit: 2021-01-30 | Discharge: 2021-01-30 | Disposition: A | Discharge status: Home / Self Care | Payer: Medicare Other | Source: Ambulatory Visit | Attending: Physician Assistant | Admitting: Physician Assistant

## 2021-01-30 ENCOUNTER — Other Ambulatory Visit: Payer: Self-pay

## 2021-01-30 DIAGNOSIS — K30 Functional dyspepsia: Secondary | ICD-10-CM | POA: Diagnosis not present

## 2021-01-30 DIAGNOSIS — R112 Nausea with vomiting, unspecified: Secondary | ICD-10-CM | POA: Insufficient documentation

## 2021-01-30 MED ORDER — TECHNETIUM TC 99M SULFUR COLLOID
2.0000 | Freq: Once | INTRAVENOUS | Status: AC | PRN
  Administered 2021-01-30: 2 via INTRAVENOUS

## 2021-02-09 DIAGNOSIS — K3184 Gastroparesis: Secondary | ICD-10-CM | POA: Diagnosis not present

## 2021-02-09 DIAGNOSIS — K219 Gastro-esophageal reflux disease without esophagitis: Secondary | ICD-10-CM | POA: Diagnosis not present

## 2021-02-20 ENCOUNTER — Other Ambulatory Visit: Payer: Self-pay

## 2021-02-20 ENCOUNTER — Encounter (INDEPENDENT_AMBULATORY_CARE_PROVIDER_SITE_OTHER): Payer: Medicare Other | Admitting: Ophthalmology

## 2021-02-20 DIAGNOSIS — H43813 Vitreous degeneration, bilateral: Secondary | ICD-10-CM | POA: Diagnosis not present

## 2021-02-20 DIAGNOSIS — I10 Essential (primary) hypertension: Secondary | ICD-10-CM

## 2021-02-20 DIAGNOSIS — H353112 Nonexudative age-related macular degeneration, right eye, intermediate dry stage: Secondary | ICD-10-CM | POA: Diagnosis not present

## 2021-02-20 DIAGNOSIS — H353221 Exudative age-related macular degeneration, left eye, with active choroidal neovascularization: Secondary | ICD-10-CM | POA: Diagnosis not present

## 2021-02-20 DIAGNOSIS — H35033 Hypertensive retinopathy, bilateral: Secondary | ICD-10-CM

## 2021-02-21 DIAGNOSIS — M25552 Pain in left hip: Secondary | ICD-10-CM | POA: Diagnosis not present

## 2021-02-21 DIAGNOSIS — M19012 Primary osteoarthritis, left shoulder: Secondary | ICD-10-CM | POA: Diagnosis not present

## 2021-02-21 DIAGNOSIS — M1712 Unilateral primary osteoarthritis, left knee: Secondary | ICD-10-CM | POA: Diagnosis not present

## 2021-03-01 DIAGNOSIS — E785 Hyperlipidemia, unspecified: Secondary | ICD-10-CM | POA: Diagnosis not present

## 2021-03-01 DIAGNOSIS — N182 Chronic kidney disease, stage 2 (mild): Secondary | ICD-10-CM | POA: Diagnosis not present

## 2021-03-01 DIAGNOSIS — M179 Osteoarthritis of knee, unspecified: Secondary | ICD-10-CM | POA: Diagnosis not present

## 2021-03-01 DIAGNOSIS — K219 Gastro-esophageal reflux disease without esophagitis: Secondary | ICD-10-CM | POA: Diagnosis not present

## 2021-03-01 DIAGNOSIS — M1990 Unspecified osteoarthritis, unspecified site: Secondary | ICD-10-CM | POA: Diagnosis not present

## 2021-03-01 DIAGNOSIS — N183 Chronic kidney disease, stage 3 unspecified: Secondary | ICD-10-CM | POA: Diagnosis not present

## 2021-03-01 DIAGNOSIS — I10 Essential (primary) hypertension: Secondary | ICD-10-CM | POA: Diagnosis not present

## 2021-03-01 DIAGNOSIS — E7800 Pure hypercholesterolemia, unspecified: Secondary | ICD-10-CM | POA: Diagnosis not present

## 2021-03-21 IMAGING — CT CT ABD-PELV W/ CM
2 of 5 series · 11 of 46 positions shown, 12 images · IV contrast (iopamidol)
Comparison: 03/05/2018

CLINICAL DATA: Nausea and vomiting for 2 days.

EXAM:
CT ABDOMEN AND PELVIS WITH CONTRAST
TECHNIQUE: Multidetector CT imaging of the abdomen and pelvis was performed
using the standard protocol following bolus administration of
intravenous contrast.
CONTRAST:  100mL [id number] IOPAMIDOL ([id number]) INJECTION 61%

[Series 2: abd pelvis 5.00 br40 s3 axial · axial · 0.50mm/px · z∈[+1419,+1739]mm · 8 of 82 slices shown, 9 images]
[im 9/82  soft-tissue]
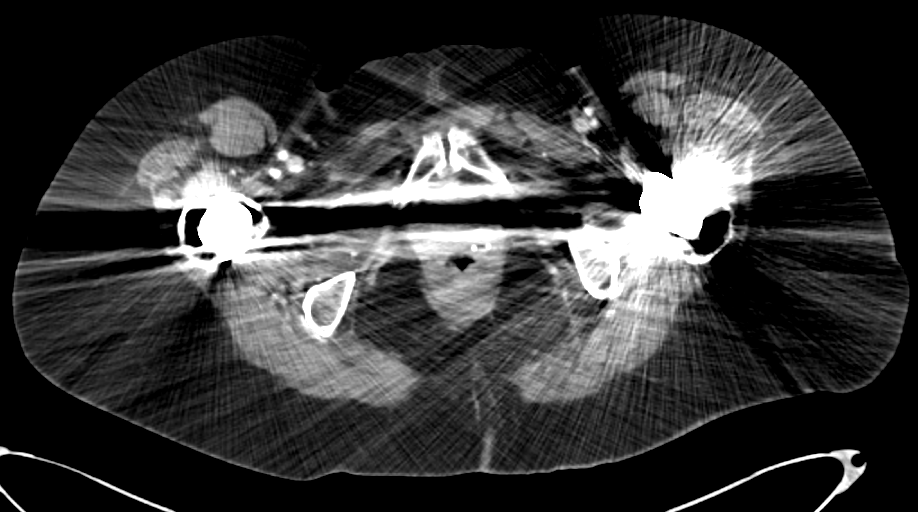
[im 9/82  bone]
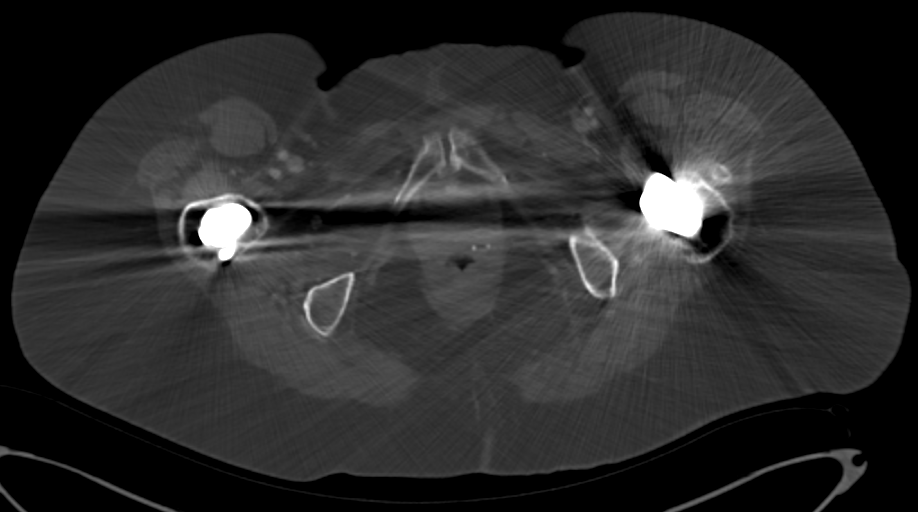
[im 18/82  soft-tissue]
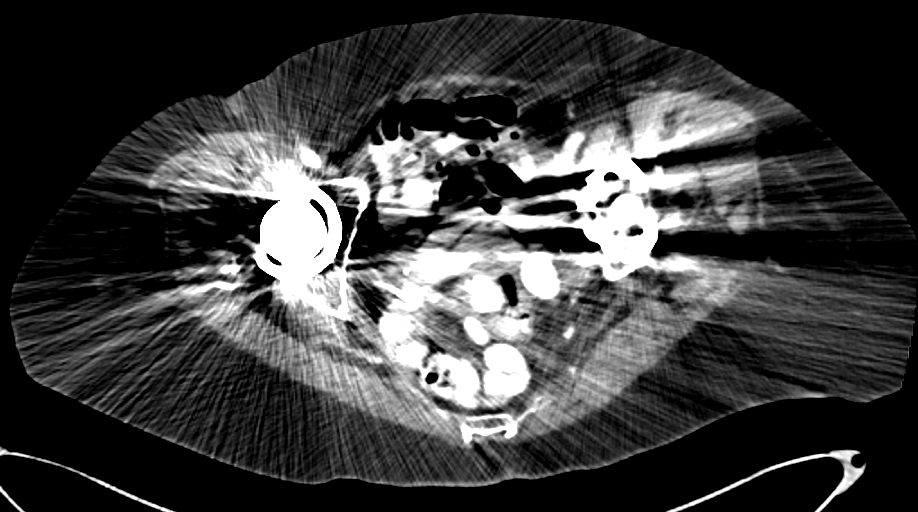
[im 26/82  soft-tissue]
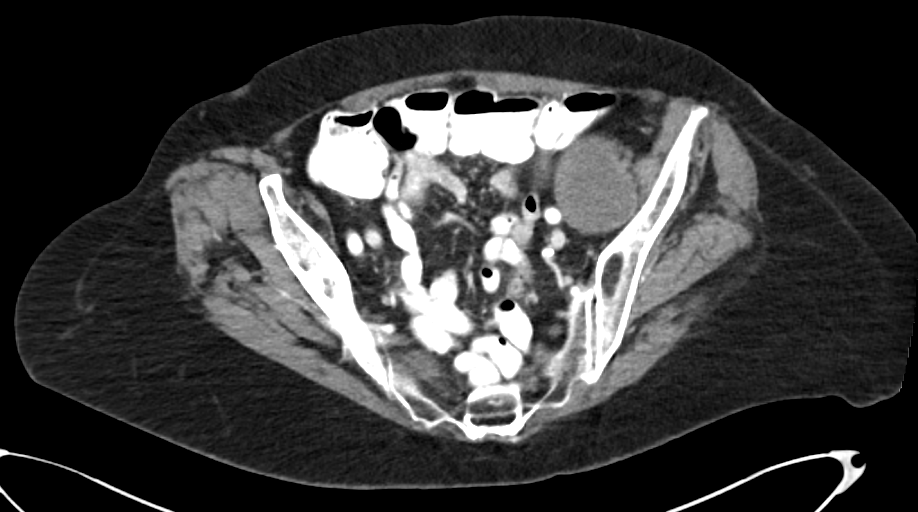
[im 35/82  soft-tissue]
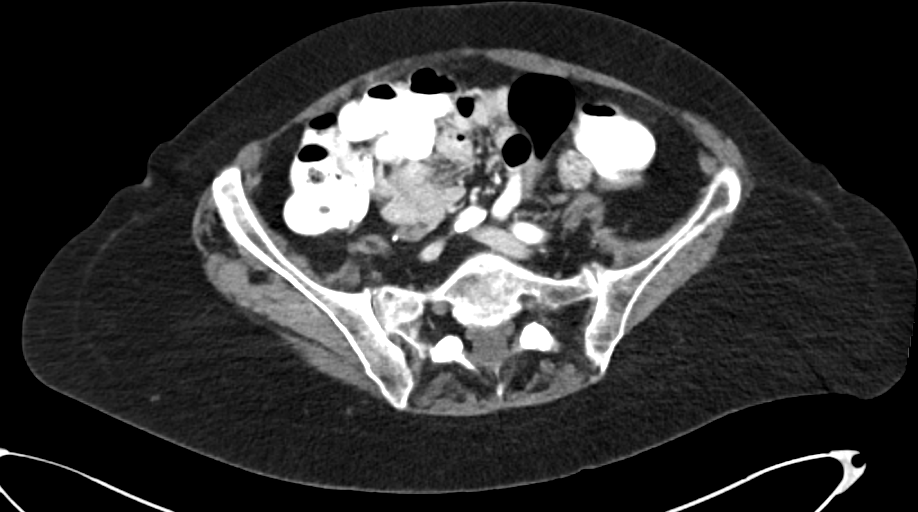
[im 47/82  soft-tissue]
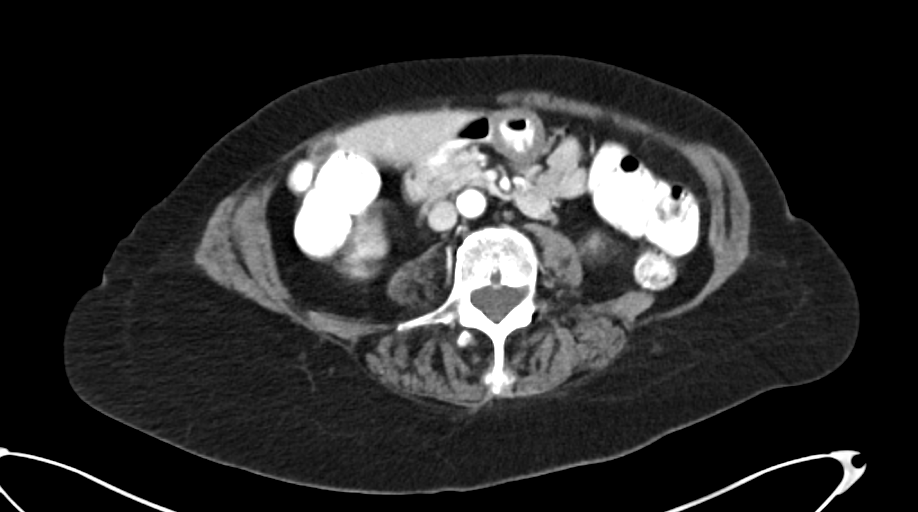
[im 56/82  soft-tissue]
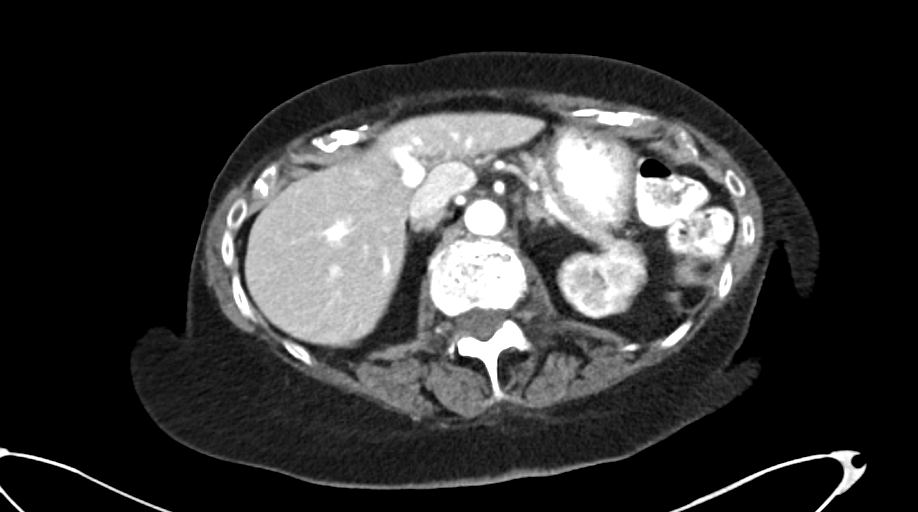
[im 64/82  soft-tissue]
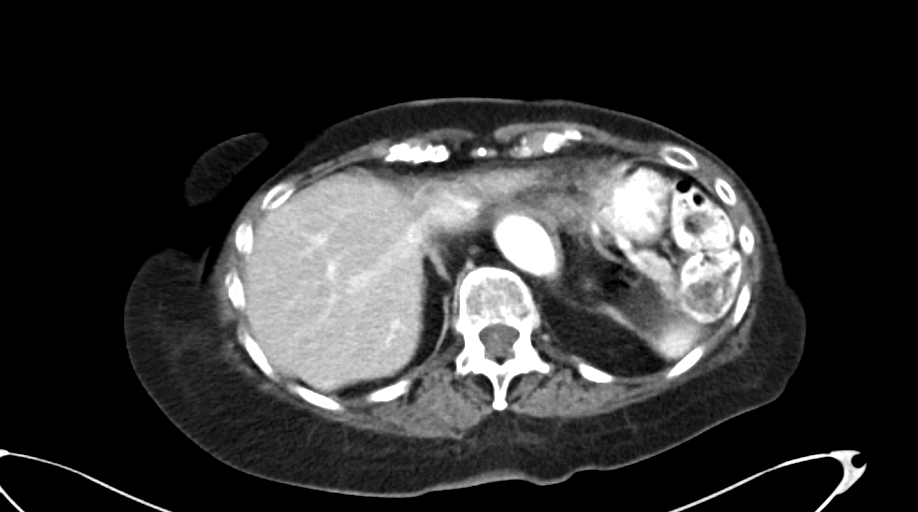
[im 73/82  soft-tissue]
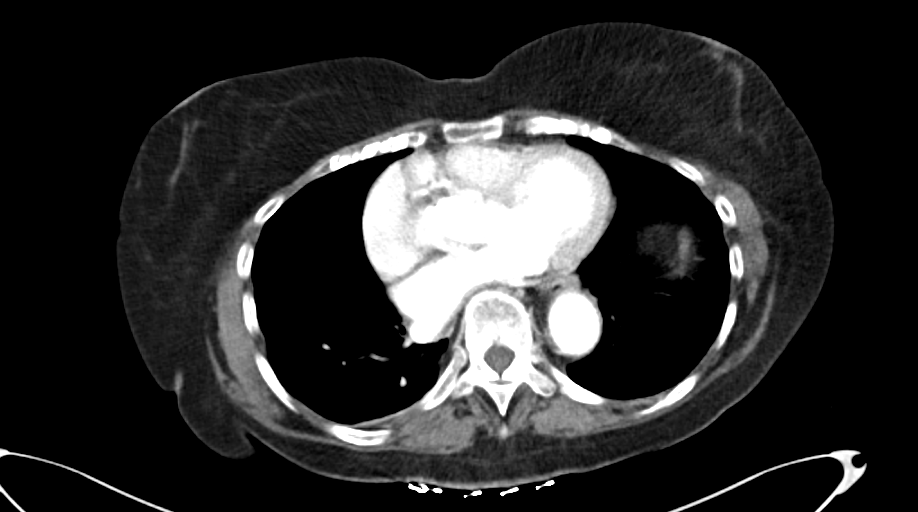

[Series 6: abd pelvis 2.00 br40 s3 cor · coronal · 0.80mm/px · 3 of 128 slices shown]
[im 43/128  soft-tissue]
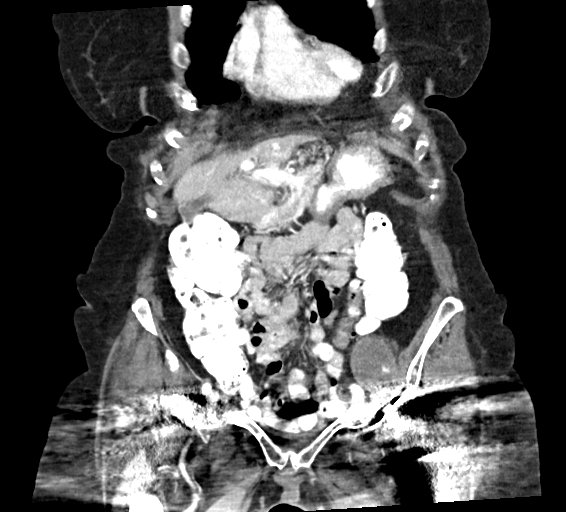
[im 57/128  soft-tissue]
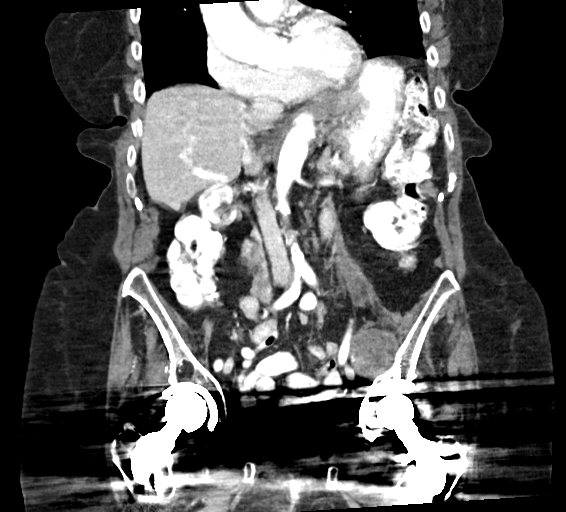
[im 71/128  soft-tissue]
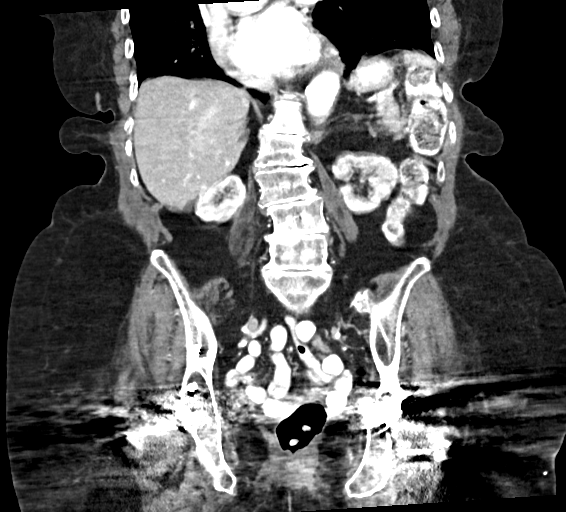

[11 of 46 positions shown; findings below may reference images not displayed]

FINDINGS: Lower Chest: No acute findings.

Hepatobiliary: No hepatic masses identified. Gallbladder is
unremarkable. No evidence of biliary ductal dilatation.

Pancreas:  No mass or inflammatory changes.

Spleen: Within normal limits in size and appearance.

Adrenals/Urinary Tract: No masses identified. Small left renal cyst
noted. No evidence of ureteral calculi or hydronephrosis. Urinary
bladder not visualized due to severe beam hardening artifact from
bilateral hip prostheses.

Stomach/Bowel: No evidence of obstruction, inflammatory process or
abnormal fluid collections.

Vascular/Lymphatic: No pathologically enlarged lymph nodes. No
abdominal aortic aneurysm. Aortic atherosclerotic calcification
noted.

Reproductive: Not visualized due to severe beam hardening artifact
from bilateral hip prostheses.

Other: A low-attenuation mass is again seen between the left
external iliac vessels and left iliacus muscle. This now contains
small areas of calcification, and shows slight increase in size.
This currently measures 5.7 x 4.2 cm on image 60/2, compared to
x 4.1 cm previously.

Musculoskeletal:  No suspicious bone lesions identified.
IMPRESSION: No acute findings.

Slight increase in size of left pelvic "mass" located between left
external iliac vessels and iliacus muscle. This is most likely due
to particle disease from left hip arthroplasty, or chronic iliopsoas
bursitis.

Aortic Atherosclerosis ([id number]-SN0.0).

## 2021-03-30 DIAGNOSIS — M1990 Unspecified osteoarthritis, unspecified site: Secondary | ICD-10-CM | POA: Diagnosis not present

## 2021-03-30 DIAGNOSIS — E7800 Pure hypercholesterolemia, unspecified: Secondary | ICD-10-CM | POA: Diagnosis not present

## 2021-03-30 DIAGNOSIS — E785 Hyperlipidemia, unspecified: Secondary | ICD-10-CM | POA: Diagnosis not present

## 2021-03-30 DIAGNOSIS — K219 Gastro-esophageal reflux disease without esophagitis: Secondary | ICD-10-CM | POA: Diagnosis not present

## 2021-03-30 DIAGNOSIS — N183 Chronic kidney disease, stage 3 unspecified: Secondary | ICD-10-CM | POA: Diagnosis not present

## 2021-03-30 DIAGNOSIS — N182 Chronic kidney disease, stage 2 (mild): Secondary | ICD-10-CM | POA: Diagnosis not present

## 2021-03-30 DIAGNOSIS — I10 Essential (primary) hypertension: Secondary | ICD-10-CM | POA: Diagnosis not present

## 2021-03-30 DIAGNOSIS — M179 Osteoarthritis of knee, unspecified: Secondary | ICD-10-CM | POA: Diagnosis not present

## 2021-04-10 ENCOUNTER — Encounter (INDEPENDENT_AMBULATORY_CARE_PROVIDER_SITE_OTHER): Payer: Medicare Other | Admitting: Ophthalmology

## 2021-04-10 ENCOUNTER — Other Ambulatory Visit: Payer: Self-pay

## 2021-04-10 DIAGNOSIS — I10 Essential (primary) hypertension: Secondary | ICD-10-CM | POA: Diagnosis not present

## 2021-04-10 DIAGNOSIS — H353112 Nonexudative age-related macular degeneration, right eye, intermediate dry stage: Secondary | ICD-10-CM

## 2021-04-10 DIAGNOSIS — H353221 Exudative age-related macular degeneration, left eye, with active choroidal neovascularization: Secondary | ICD-10-CM

## 2021-04-10 DIAGNOSIS — H43813 Vitreous degeneration, bilateral: Secondary | ICD-10-CM | POA: Diagnosis not present

## 2021-04-10 DIAGNOSIS — H35033 Hypertensive retinopathy, bilateral: Secondary | ICD-10-CM

## 2021-04-16 DIAGNOSIS — K3184 Gastroparesis: Secondary | ICD-10-CM | POA: Diagnosis not present

## 2021-04-16 DIAGNOSIS — R110 Nausea: Secondary | ICD-10-CM | POA: Diagnosis not present

## 2021-04-27 DIAGNOSIS — M1990 Unspecified osteoarthritis, unspecified site: Secondary | ICD-10-CM | POA: Diagnosis not present

## 2021-04-27 DIAGNOSIS — M179 Osteoarthritis of knee, unspecified: Secondary | ICD-10-CM | POA: Diagnosis not present

## 2021-04-27 DIAGNOSIS — N183 Chronic kidney disease, stage 3 unspecified: Secondary | ICD-10-CM | POA: Diagnosis not present

## 2021-04-27 DIAGNOSIS — K219 Gastro-esophageal reflux disease without esophagitis: Secondary | ICD-10-CM | POA: Diagnosis not present

## 2021-04-27 DIAGNOSIS — E7800 Pure hypercholesterolemia, unspecified: Secondary | ICD-10-CM | POA: Diagnosis not present

## 2021-04-27 DIAGNOSIS — E785 Hyperlipidemia, unspecified: Secondary | ICD-10-CM | POA: Diagnosis not present

## 2021-04-27 DIAGNOSIS — I10 Essential (primary) hypertension: Secondary | ICD-10-CM | POA: Diagnosis not present

## 2021-04-27 DIAGNOSIS — N182 Chronic kidney disease, stage 2 (mild): Secondary | ICD-10-CM | POA: Diagnosis not present

## 2021-05-31 DIAGNOSIS — I10 Essential (primary) hypertension: Secondary | ICD-10-CM | POA: Diagnosis not present

## 2021-05-31 DIAGNOSIS — N182 Chronic kidney disease, stage 2 (mild): Secondary | ICD-10-CM | POA: Diagnosis not present

## 2021-05-31 DIAGNOSIS — K219 Gastro-esophageal reflux disease without esophagitis: Secondary | ICD-10-CM | POA: Diagnosis not present

## 2021-05-31 DIAGNOSIS — E7800 Pure hypercholesterolemia, unspecified: Secondary | ICD-10-CM | POA: Diagnosis not present

## 2021-05-31 DIAGNOSIS — E785 Hyperlipidemia, unspecified: Secondary | ICD-10-CM | POA: Diagnosis not present

## 2021-05-31 DIAGNOSIS — N183 Chronic kidney disease, stage 3 unspecified: Secondary | ICD-10-CM | POA: Diagnosis not present

## 2021-05-31 DIAGNOSIS — M1990 Unspecified osteoarthritis, unspecified site: Secondary | ICD-10-CM | POA: Diagnosis not present

## 2021-06-08 ENCOUNTER — Other Ambulatory Visit: Payer: Self-pay

## 2021-06-08 ENCOUNTER — Encounter (INDEPENDENT_AMBULATORY_CARE_PROVIDER_SITE_OTHER): Payer: Medicare Other | Admitting: Ophthalmology

## 2021-06-08 DIAGNOSIS — H353112 Nonexudative age-related macular degeneration, right eye, intermediate dry stage: Secondary | ICD-10-CM | POA: Diagnosis not present

## 2021-06-08 DIAGNOSIS — H353221 Exudative age-related macular degeneration, left eye, with active choroidal neovascularization: Secondary | ICD-10-CM

## 2021-06-08 DIAGNOSIS — I10 Essential (primary) hypertension: Secondary | ICD-10-CM | POA: Diagnosis not present

## 2021-06-08 DIAGNOSIS — H43813 Vitreous degeneration, bilateral: Secondary | ICD-10-CM | POA: Diagnosis not present

## 2021-06-19 DIAGNOSIS — I700 Atherosclerosis of aorta: Secondary | ICD-10-CM | POA: Diagnosis not present

## 2021-06-19 DIAGNOSIS — K3184 Gastroparesis: Secondary | ICD-10-CM | POA: Diagnosis not present

## 2021-06-19 DIAGNOSIS — H3530 Unspecified macular degeneration: Secondary | ICD-10-CM | POA: Diagnosis not present

## 2021-06-19 DIAGNOSIS — K219 Gastro-esophageal reflux disease without esophagitis: Secondary | ICD-10-CM | POA: Diagnosis not present

## 2021-06-19 DIAGNOSIS — F5101 Primary insomnia: Secondary | ICD-10-CM | POA: Diagnosis not present

## 2021-06-19 DIAGNOSIS — M1990 Unspecified osteoarthritis, unspecified site: Secondary | ICD-10-CM | POA: Diagnosis not present

## 2021-06-19 DIAGNOSIS — I10 Essential (primary) hypertension: Secondary | ICD-10-CM | POA: Diagnosis not present

## 2021-06-19 DIAGNOSIS — E7800 Pure hypercholesterolemia, unspecified: Secondary | ICD-10-CM | POA: Diagnosis not present

## 2021-06-19 DIAGNOSIS — N183 Chronic kidney disease, stage 3 unspecified: Secondary | ICD-10-CM | POA: Diagnosis not present

## 2021-06-19 DIAGNOSIS — R269 Unspecified abnormalities of gait and mobility: Secondary | ICD-10-CM | POA: Diagnosis not present

## 2021-06-26 DIAGNOSIS — N183 Chronic kidney disease, stage 3 unspecified: Secondary | ICD-10-CM | POA: Diagnosis not present

## 2021-06-26 DIAGNOSIS — E7800 Pure hypercholesterolemia, unspecified: Secondary | ICD-10-CM | POA: Diagnosis not present

## 2021-06-26 DIAGNOSIS — I10 Essential (primary) hypertension: Secondary | ICD-10-CM | POA: Diagnosis not present

## 2021-06-26 DIAGNOSIS — E785 Hyperlipidemia, unspecified: Secondary | ICD-10-CM | POA: Diagnosis not present

## 2021-06-26 DIAGNOSIS — K219 Gastro-esophageal reflux disease without esophagitis: Secondary | ICD-10-CM | POA: Diagnosis not present

## 2021-06-26 DIAGNOSIS — M179 Osteoarthritis of knee, unspecified: Secondary | ICD-10-CM | POA: Diagnosis not present

## 2021-06-26 DIAGNOSIS — M1990 Unspecified osteoarthritis, unspecified site: Secondary | ICD-10-CM | POA: Diagnosis not present

## 2021-07-06 ENCOUNTER — Encounter (INDEPENDENT_AMBULATORY_CARE_PROVIDER_SITE_OTHER): Payer: Medicare Other | Admitting: Ophthalmology

## 2021-07-06 ENCOUNTER — Other Ambulatory Visit: Payer: Self-pay

## 2021-07-06 DIAGNOSIS — H353221 Exudative age-related macular degeneration, left eye, with active choroidal neovascularization: Secondary | ICD-10-CM

## 2021-07-06 DIAGNOSIS — H43813 Vitreous degeneration, bilateral: Secondary | ICD-10-CM | POA: Diagnosis not present

## 2021-07-06 DIAGNOSIS — I10 Essential (primary) hypertension: Secondary | ICD-10-CM | POA: Diagnosis not present

## 2021-07-06 DIAGNOSIS — H353112 Nonexudative age-related macular degeneration, right eye, intermediate dry stage: Secondary | ICD-10-CM

## 2021-07-06 DIAGNOSIS — H35033 Hypertensive retinopathy, bilateral: Secondary | ICD-10-CM

## 2021-07-26 DIAGNOSIS — I10 Essential (primary) hypertension: Secondary | ICD-10-CM | POA: Diagnosis not present

## 2021-07-26 DIAGNOSIS — E785 Hyperlipidemia, unspecified: Secondary | ICD-10-CM | POA: Diagnosis not present

## 2021-07-26 DIAGNOSIS — M179 Osteoarthritis of knee, unspecified: Secondary | ICD-10-CM | POA: Diagnosis not present

## 2021-07-26 DIAGNOSIS — E7800 Pure hypercholesterolemia, unspecified: Secondary | ICD-10-CM | POA: Diagnosis not present

## 2021-07-26 DIAGNOSIS — N182 Chronic kidney disease, stage 2 (mild): Secondary | ICD-10-CM | POA: Diagnosis not present

## 2021-07-26 DIAGNOSIS — N183 Chronic kidney disease, stage 3 unspecified: Secondary | ICD-10-CM | POA: Diagnosis not present

## 2021-07-26 DIAGNOSIS — M1990 Unspecified osteoarthritis, unspecified site: Secondary | ICD-10-CM | POA: Diagnosis not present

## 2021-07-26 DIAGNOSIS — K219 Gastro-esophageal reflux disease without esophagitis: Secondary | ICD-10-CM | POA: Diagnosis not present

## 2021-08-06 ENCOUNTER — Other Ambulatory Visit: Payer: Self-pay

## 2021-08-06 ENCOUNTER — Encounter (INDEPENDENT_AMBULATORY_CARE_PROVIDER_SITE_OTHER): Payer: Medicare Other | Admitting: Ophthalmology

## 2021-08-06 DIAGNOSIS — H43813 Vitreous degeneration, bilateral: Secondary | ICD-10-CM

## 2021-08-06 DIAGNOSIS — H353221 Exudative age-related macular degeneration, left eye, with active choroidal neovascularization: Secondary | ICD-10-CM

## 2021-08-06 DIAGNOSIS — I10 Essential (primary) hypertension: Secondary | ICD-10-CM

## 2021-08-06 DIAGNOSIS — H353112 Nonexudative age-related macular degeneration, right eye, intermediate dry stage: Secondary | ICD-10-CM

## 2021-08-06 DIAGNOSIS — H35033 Hypertensive retinopathy, bilateral: Secondary | ICD-10-CM | POA: Diagnosis not present

## 2021-08-24 DIAGNOSIS — M179 Osteoarthritis of knee, unspecified: Secondary | ICD-10-CM | POA: Diagnosis not present

## 2021-08-24 DIAGNOSIS — N183 Chronic kidney disease, stage 3 unspecified: Secondary | ICD-10-CM | POA: Diagnosis not present

## 2021-08-24 DIAGNOSIS — E785 Hyperlipidemia, unspecified: Secondary | ICD-10-CM | POA: Diagnosis not present

## 2021-08-24 DIAGNOSIS — K219 Gastro-esophageal reflux disease without esophagitis: Secondary | ICD-10-CM | POA: Diagnosis not present

## 2021-08-24 DIAGNOSIS — M1990 Unspecified osteoarthritis, unspecified site: Secondary | ICD-10-CM | POA: Diagnosis not present

## 2021-08-24 DIAGNOSIS — E7800 Pure hypercholesterolemia, unspecified: Secondary | ICD-10-CM | POA: Diagnosis not present

## 2021-08-24 DIAGNOSIS — N182 Chronic kidney disease, stage 2 (mild): Secondary | ICD-10-CM | POA: Diagnosis not present

## 2021-08-24 DIAGNOSIS — I10 Essential (primary) hypertension: Secondary | ICD-10-CM | POA: Diagnosis not present

## 2021-09-10 ENCOUNTER — Other Ambulatory Visit: Payer: Self-pay

## 2021-09-10 ENCOUNTER — Encounter (INDEPENDENT_AMBULATORY_CARE_PROVIDER_SITE_OTHER): Payer: Medicare Other | Admitting: Ophthalmology

## 2021-09-10 DIAGNOSIS — H35033 Hypertensive retinopathy, bilateral: Secondary | ICD-10-CM

## 2021-09-10 DIAGNOSIS — I10 Essential (primary) hypertension: Secondary | ICD-10-CM

## 2021-09-10 DIAGNOSIS — H353221 Exudative age-related macular degeneration, left eye, with active choroidal neovascularization: Secondary | ICD-10-CM | POA: Diagnosis not present

## 2021-09-10 DIAGNOSIS — H353112 Nonexudative age-related macular degeneration, right eye, intermediate dry stage: Secondary | ICD-10-CM

## 2021-09-10 DIAGNOSIS — H43813 Vitreous degeneration, bilateral: Secondary | ICD-10-CM | POA: Diagnosis not present

## 2021-09-21 DIAGNOSIS — I10 Essential (primary) hypertension: Secondary | ICD-10-CM | POA: Diagnosis not present

## 2021-09-21 DIAGNOSIS — K219 Gastro-esophageal reflux disease without esophagitis: Secondary | ICD-10-CM | POA: Diagnosis not present

## 2021-09-21 DIAGNOSIS — E785 Hyperlipidemia, unspecified: Secondary | ICD-10-CM | POA: Diagnosis not present

## 2021-09-21 DIAGNOSIS — E7800 Pure hypercholesterolemia, unspecified: Secondary | ICD-10-CM | POA: Diagnosis not present

## 2021-09-21 DIAGNOSIS — N182 Chronic kidney disease, stage 2 (mild): Secondary | ICD-10-CM | POA: Diagnosis not present

## 2021-09-21 DIAGNOSIS — N183 Chronic kidney disease, stage 3 unspecified: Secondary | ICD-10-CM | POA: Diagnosis not present

## 2021-09-21 DIAGNOSIS — M179 Osteoarthritis of knee, unspecified: Secondary | ICD-10-CM | POA: Diagnosis not present

## 2021-09-21 DIAGNOSIS — M1990 Unspecified osteoarthritis, unspecified site: Secondary | ICD-10-CM | POA: Diagnosis not present

## 2021-10-08 DIAGNOSIS — I10 Essential (primary) hypertension: Secondary | ICD-10-CM | POA: Diagnosis not present

## 2021-10-15 ENCOUNTER — Encounter (INDEPENDENT_AMBULATORY_CARE_PROVIDER_SITE_OTHER): Payer: Medicare Other | Admitting: Ophthalmology

## 2021-10-15 ENCOUNTER — Other Ambulatory Visit: Payer: Self-pay

## 2021-10-15 DIAGNOSIS — H35033 Hypertensive retinopathy, bilateral: Secondary | ICD-10-CM

## 2021-10-15 DIAGNOSIS — H353112 Nonexudative age-related macular degeneration, right eye, intermediate dry stage: Secondary | ICD-10-CM

## 2021-10-15 DIAGNOSIS — H353221 Exudative age-related macular degeneration, left eye, with active choroidal neovascularization: Secondary | ICD-10-CM | POA: Diagnosis not present

## 2021-10-15 DIAGNOSIS — H43813 Vitreous degeneration, bilateral: Secondary | ICD-10-CM | POA: Diagnosis not present

## 2021-10-15 DIAGNOSIS — I10 Essential (primary) hypertension: Secondary | ICD-10-CM

## 2021-11-04 DIAGNOSIS — N182 Chronic kidney disease, stage 2 (mild): Secondary | ICD-10-CM | POA: Diagnosis not present

## 2021-11-04 DIAGNOSIS — M179 Osteoarthritis of knee, unspecified: Secondary | ICD-10-CM | POA: Diagnosis not present

## 2021-11-04 DIAGNOSIS — N183 Chronic kidney disease, stage 3 unspecified: Secondary | ICD-10-CM | POA: Diagnosis not present

## 2021-11-04 DIAGNOSIS — E7800 Pure hypercholesterolemia, unspecified: Secondary | ICD-10-CM | POA: Diagnosis not present

## 2021-11-04 DIAGNOSIS — I10 Essential (primary) hypertension: Secondary | ICD-10-CM | POA: Diagnosis not present

## 2021-11-04 DIAGNOSIS — K219 Gastro-esophageal reflux disease without esophagitis: Secondary | ICD-10-CM | POA: Diagnosis not present

## 2021-11-04 DIAGNOSIS — E785 Hyperlipidemia, unspecified: Secondary | ICD-10-CM | POA: Diagnosis not present

## 2021-11-04 DIAGNOSIS — M1990 Unspecified osteoarthritis, unspecified site: Secondary | ICD-10-CM | POA: Diagnosis not present

## 2021-11-14 DIAGNOSIS — M19012 Primary osteoarthritis, left shoulder: Secondary | ICD-10-CM | POA: Diagnosis not present

## 2021-11-14 DIAGNOSIS — M1712 Unilateral primary osteoarthritis, left knee: Secondary | ICD-10-CM | POA: Diagnosis not present

## 2021-11-15 DIAGNOSIS — H903 Sensorineural hearing loss, bilateral: Secondary | ICD-10-CM | POA: Diagnosis not present

## 2021-11-19 ENCOUNTER — Other Ambulatory Visit: Payer: Self-pay

## 2021-11-19 ENCOUNTER — Encounter (INDEPENDENT_AMBULATORY_CARE_PROVIDER_SITE_OTHER): Payer: Medicare Other | Admitting: Ophthalmology

## 2021-11-19 DIAGNOSIS — H353221 Exudative age-related macular degeneration, left eye, with active choroidal neovascularization: Secondary | ICD-10-CM

## 2021-11-19 DIAGNOSIS — H353112 Nonexudative age-related macular degeneration, right eye, intermediate dry stage: Secondary | ICD-10-CM | POA: Diagnosis not present

## 2021-11-19 DIAGNOSIS — H43813 Vitreous degeneration, bilateral: Secondary | ICD-10-CM | POA: Diagnosis not present

## 2021-11-19 DIAGNOSIS — H35033 Hypertensive retinopathy, bilateral: Secondary | ICD-10-CM

## 2021-11-19 DIAGNOSIS — I10 Essential (primary) hypertension: Secondary | ICD-10-CM

## 2021-12-04 DIAGNOSIS — E785 Hyperlipidemia, unspecified: Secondary | ICD-10-CM | POA: Diagnosis not present

## 2021-12-04 DIAGNOSIS — N183 Chronic kidney disease, stage 3 unspecified: Secondary | ICD-10-CM | POA: Diagnosis not present

## 2021-12-04 DIAGNOSIS — M1990 Unspecified osteoarthritis, unspecified site: Secondary | ICD-10-CM | POA: Diagnosis not present

## 2021-12-04 DIAGNOSIS — K219 Gastro-esophageal reflux disease without esophagitis: Secondary | ICD-10-CM | POA: Diagnosis not present

## 2021-12-04 DIAGNOSIS — I10 Essential (primary) hypertension: Secondary | ICD-10-CM | POA: Diagnosis not present

## 2021-12-04 DIAGNOSIS — E7800 Pure hypercholesterolemia, unspecified: Secondary | ICD-10-CM | POA: Diagnosis not present

## 2021-12-20 DIAGNOSIS — E785 Hyperlipidemia, unspecified: Secondary | ICD-10-CM | POA: Diagnosis not present

## 2021-12-20 DIAGNOSIS — E876 Hypokalemia: Secondary | ICD-10-CM | POA: Diagnosis not present

## 2021-12-20 DIAGNOSIS — I700 Atherosclerosis of aorta: Secondary | ICD-10-CM | POA: Diagnosis not present

## 2021-12-20 DIAGNOSIS — Z0000 Encounter for general adult medical examination without abnormal findings: Secondary | ICD-10-CM | POA: Diagnosis not present

## 2021-12-20 DIAGNOSIS — M85859 Other specified disorders of bone density and structure, unspecified thigh: Secondary | ICD-10-CM | POA: Diagnosis not present

## 2021-12-20 DIAGNOSIS — F5101 Primary insomnia: Secondary | ICD-10-CM | POA: Diagnosis not present

## 2021-12-20 DIAGNOSIS — K219 Gastro-esophageal reflux disease without esophagitis: Secondary | ICD-10-CM | POA: Diagnosis not present

## 2021-12-20 DIAGNOSIS — R700 Elevated erythrocyte sedimentation rate: Secondary | ICD-10-CM | POA: Diagnosis not present

## 2021-12-20 DIAGNOSIS — N183 Chronic kidney disease, stage 3 unspecified: Secondary | ICD-10-CM | POA: Diagnosis not present

## 2021-12-20 DIAGNOSIS — I10 Essential (primary) hypertension: Secondary | ICD-10-CM | POA: Diagnosis not present

## 2021-12-20 DIAGNOSIS — H3530 Unspecified macular degeneration: Secondary | ICD-10-CM | POA: Diagnosis not present

## 2021-12-20 DIAGNOSIS — E7800 Pure hypercholesterolemia, unspecified: Secondary | ICD-10-CM | POA: Diagnosis not present

## 2021-12-20 DIAGNOSIS — M1990 Unspecified osteoarthritis, unspecified site: Secondary | ICD-10-CM | POA: Diagnosis not present

## 2021-12-20 DIAGNOSIS — Z1389 Encounter for screening for other disorder: Secondary | ICD-10-CM | POA: Diagnosis not present

## 2021-12-31 ENCOUNTER — Encounter (INDEPENDENT_AMBULATORY_CARE_PROVIDER_SITE_OTHER): Payer: Medicare Other | Admitting: Ophthalmology

## 2021-12-31 ENCOUNTER — Other Ambulatory Visit: Payer: Self-pay

## 2021-12-31 DIAGNOSIS — H353221 Exudative age-related macular degeneration, left eye, with active choroidal neovascularization: Secondary | ICD-10-CM | POA: Diagnosis not present

## 2021-12-31 DIAGNOSIS — H35033 Hypertensive retinopathy, bilateral: Secondary | ICD-10-CM | POA: Diagnosis not present

## 2021-12-31 DIAGNOSIS — H353112 Nonexudative age-related macular degeneration, right eye, intermediate dry stage: Secondary | ICD-10-CM | POA: Diagnosis not present

## 2021-12-31 DIAGNOSIS — I10 Essential (primary) hypertension: Secondary | ICD-10-CM | POA: Diagnosis not present

## 2021-12-31 DIAGNOSIS — H43813 Vitreous degeneration, bilateral: Secondary | ICD-10-CM

## 2022-01-14 DIAGNOSIS — M1712 Unilateral primary osteoarthritis, left knee: Secondary | ICD-10-CM | POA: Diagnosis not present

## 2022-01-18 DIAGNOSIS — K219 Gastro-esophageal reflux disease without esophagitis: Secondary | ICD-10-CM | POA: Diagnosis not present

## 2022-01-18 DIAGNOSIS — I10 Essential (primary) hypertension: Secondary | ICD-10-CM | POA: Diagnosis not present

## 2022-01-18 DIAGNOSIS — E7800 Pure hypercholesterolemia, unspecified: Secondary | ICD-10-CM | POA: Diagnosis not present

## 2022-01-21 DIAGNOSIS — M1712 Unilateral primary osteoarthritis, left knee: Secondary | ICD-10-CM | POA: Diagnosis not present

## 2022-01-28 DIAGNOSIS — M1712 Unilateral primary osteoarthritis, left knee: Secondary | ICD-10-CM | POA: Diagnosis not present

## 2022-02-04 ENCOUNTER — Other Ambulatory Visit: Payer: Self-pay

## 2022-02-04 ENCOUNTER — Encounter (INDEPENDENT_AMBULATORY_CARE_PROVIDER_SITE_OTHER): Payer: Medicare Other | Admitting: Ophthalmology

## 2022-02-04 DIAGNOSIS — H353112 Nonexudative age-related macular degeneration, right eye, intermediate dry stage: Secondary | ICD-10-CM

## 2022-02-04 DIAGNOSIS — H35033 Hypertensive retinopathy, bilateral: Secondary | ICD-10-CM

## 2022-02-04 DIAGNOSIS — H43813 Vitreous degeneration, bilateral: Secondary | ICD-10-CM | POA: Diagnosis not present

## 2022-02-04 DIAGNOSIS — I10 Essential (primary) hypertension: Secondary | ICD-10-CM | POA: Diagnosis not present

## 2022-02-04 DIAGNOSIS — H353221 Exudative age-related macular degeneration, left eye, with active choroidal neovascularization: Secondary | ICD-10-CM | POA: Diagnosis not present

## 2022-02-11 DIAGNOSIS — R5382 Chronic fatigue, unspecified: Secondary | ICD-10-CM | POA: Diagnosis not present

## 2022-02-11 DIAGNOSIS — M79642 Pain in left hand: Secondary | ICD-10-CM | POA: Diagnosis not present

## 2022-02-11 DIAGNOSIS — R700 Elevated erythrocyte sedimentation rate: Secondary | ICD-10-CM | POA: Diagnosis not present

## 2022-02-11 DIAGNOSIS — M79641 Pain in right hand: Secondary | ICD-10-CM | POA: Diagnosis not present

## 2022-02-11 DIAGNOSIS — M150 Primary generalized (osteo)arthritis: Secondary | ICD-10-CM | POA: Diagnosis not present

## 2022-02-20 DIAGNOSIS — H43812 Vitreous degeneration, left eye: Secondary | ICD-10-CM | POA: Diagnosis not present

## 2022-02-20 DIAGNOSIS — H353112 Nonexudative age-related macular degeneration, right eye, intermediate dry stage: Secondary | ICD-10-CM | POA: Diagnosis not present

## 2022-02-20 DIAGNOSIS — R519 Headache, unspecified: Secondary | ICD-10-CM | POA: Diagnosis not present

## 2022-02-20 DIAGNOSIS — H353221 Exudative age-related macular degeneration, left eye, with active choroidal neovascularization: Secondary | ICD-10-CM | POA: Diagnosis not present

## 2022-02-20 DIAGNOSIS — Z961 Presence of intraocular lens: Secondary | ICD-10-CM | POA: Diagnosis not present

## 2022-02-20 DIAGNOSIS — E7800 Pure hypercholesterolemia, unspecified: Secondary | ICD-10-CM | POA: Diagnosis not present

## 2022-02-20 DIAGNOSIS — I10 Essential (primary) hypertension: Secondary | ICD-10-CM | POA: Diagnosis not present

## 2022-03-11 DIAGNOSIS — M154 Erosive (osteo)arthritis: Secondary | ICD-10-CM | POA: Diagnosis not present

## 2022-03-11 DIAGNOSIS — M79641 Pain in right hand: Secondary | ICD-10-CM | POA: Diagnosis not present

## 2022-03-11 DIAGNOSIS — R768 Other specified abnormal immunological findings in serum: Secondary | ICD-10-CM | POA: Diagnosis not present

## 2022-03-11 DIAGNOSIS — R700 Elevated erythrocyte sedimentation rate: Secondary | ICD-10-CM | POA: Diagnosis not present

## 2022-03-11 DIAGNOSIS — M79642 Pain in left hand: Secondary | ICD-10-CM | POA: Diagnosis not present

## 2022-03-11 DIAGNOSIS — M1991 Primary osteoarthritis, unspecified site: Secondary | ICD-10-CM | POA: Diagnosis not present

## 2022-04-01 ENCOUNTER — Encounter (INDEPENDENT_AMBULATORY_CARE_PROVIDER_SITE_OTHER): Payer: Medicare Other | Admitting: Ophthalmology

## 2022-04-01 DIAGNOSIS — H35033 Hypertensive retinopathy, bilateral: Secondary | ICD-10-CM | POA: Diagnosis not present

## 2022-04-01 DIAGNOSIS — I10 Essential (primary) hypertension: Secondary | ICD-10-CM

## 2022-04-01 DIAGNOSIS — H43813 Vitreous degeneration, bilateral: Secondary | ICD-10-CM

## 2022-04-01 DIAGNOSIS — H353122 Nonexudative age-related macular degeneration, left eye, intermediate dry stage: Secondary | ICD-10-CM | POA: Diagnosis not present

## 2022-04-01 DIAGNOSIS — H353211 Exudative age-related macular degeneration, right eye, with active choroidal neovascularization: Secondary | ICD-10-CM | POA: Diagnosis not present

## 2022-04-26 DIAGNOSIS — E785 Hyperlipidemia, unspecified: Secondary | ICD-10-CM | POA: Diagnosis not present

## 2022-04-26 DIAGNOSIS — M179 Osteoarthritis of knee, unspecified: Secondary | ICD-10-CM | POA: Diagnosis not present

## 2022-04-26 DIAGNOSIS — I10 Essential (primary) hypertension: Secondary | ICD-10-CM | POA: Diagnosis not present

## 2022-04-26 DIAGNOSIS — K219 Gastro-esophageal reflux disease without esophagitis: Secondary | ICD-10-CM | POA: Diagnosis not present

## 2022-04-26 DIAGNOSIS — N182 Chronic kidney disease, stage 2 (mild): Secondary | ICD-10-CM | POA: Diagnosis not present

## 2022-05-17 DIAGNOSIS — E785 Hyperlipidemia, unspecified: Secondary | ICD-10-CM | POA: Diagnosis not present

## 2022-05-17 DIAGNOSIS — N182 Chronic kidney disease, stage 2 (mild): Secondary | ICD-10-CM | POA: Diagnosis not present

## 2022-05-17 DIAGNOSIS — I10 Essential (primary) hypertension: Secondary | ICD-10-CM | POA: Diagnosis not present

## 2022-05-17 DIAGNOSIS — K219 Gastro-esophageal reflux disease without esophagitis: Secondary | ICD-10-CM | POA: Diagnosis not present

## 2022-05-17 DIAGNOSIS — M1990 Unspecified osteoarthritis, unspecified site: Secondary | ICD-10-CM | POA: Diagnosis not present

## 2022-05-27 ENCOUNTER — Encounter (INDEPENDENT_AMBULATORY_CARE_PROVIDER_SITE_OTHER): Payer: Medicare Other | Admitting: Ophthalmology

## 2022-05-27 DIAGNOSIS — H43813 Vitreous degeneration, bilateral: Secondary | ICD-10-CM

## 2022-05-27 DIAGNOSIS — I10 Essential (primary) hypertension: Secondary | ICD-10-CM | POA: Diagnosis not present

## 2022-05-27 DIAGNOSIS — H353112 Nonexudative age-related macular degeneration, right eye, intermediate dry stage: Secondary | ICD-10-CM | POA: Diagnosis not present

## 2022-05-27 DIAGNOSIS — H35033 Hypertensive retinopathy, bilateral: Secondary | ICD-10-CM

## 2022-05-27 DIAGNOSIS — H353221 Exudative age-related macular degeneration, left eye, with active choroidal neovascularization: Secondary | ICD-10-CM

## 2022-05-29 DIAGNOSIS — M1990 Unspecified osteoarthritis, unspecified site: Secondary | ICD-10-CM | POA: Diagnosis not present

## 2022-05-29 DIAGNOSIS — R269 Unspecified abnormalities of gait and mobility: Secondary | ICD-10-CM | POA: Diagnosis not present

## 2022-05-29 DIAGNOSIS — F5101 Primary insomnia: Secondary | ICD-10-CM | POA: Diagnosis not present

## 2022-05-29 DIAGNOSIS — N183 Chronic kidney disease, stage 3 unspecified: Secondary | ICD-10-CM | POA: Diagnosis not present

## 2022-05-29 DIAGNOSIS — K219 Gastro-esophageal reflux disease without esophagitis: Secondary | ICD-10-CM | POA: Diagnosis not present

## 2022-05-29 DIAGNOSIS — I700 Atherosclerosis of aorta: Secondary | ICD-10-CM | POA: Diagnosis not present

## 2022-05-29 DIAGNOSIS — I10 Essential (primary) hypertension: Secondary | ICD-10-CM | POA: Diagnosis not present

## 2022-05-29 DIAGNOSIS — E7800 Pure hypercholesterolemia, unspecified: Secondary | ICD-10-CM | POA: Diagnosis not present

## 2022-06-01 DIAGNOSIS — M1712 Unilateral primary osteoarthritis, left knee: Secondary | ICD-10-CM | POA: Diagnosis not present

## 2022-06-20 DIAGNOSIS — R269 Unspecified abnormalities of gait and mobility: Secondary | ICD-10-CM | POA: Diagnosis not present

## 2022-06-20 DIAGNOSIS — K219 Gastro-esophageal reflux disease without esophagitis: Secondary | ICD-10-CM | POA: Diagnosis not present

## 2022-06-20 DIAGNOSIS — K3184 Gastroparesis: Secondary | ICD-10-CM | POA: Diagnosis not present

## 2022-06-20 DIAGNOSIS — I10 Essential (primary) hypertension: Secondary | ICD-10-CM | POA: Diagnosis not present

## 2022-06-20 DIAGNOSIS — H3530 Unspecified macular degeneration: Secondary | ICD-10-CM | POA: Diagnosis not present

## 2022-06-20 DIAGNOSIS — F5101 Primary insomnia: Secondary | ICD-10-CM | POA: Diagnosis not present

## 2022-06-20 DIAGNOSIS — N183 Chronic kidney disease, stage 3 unspecified: Secondary | ICD-10-CM | POA: Diagnosis not present

## 2022-06-20 DIAGNOSIS — I700 Atherosclerosis of aorta: Secondary | ICD-10-CM | POA: Diagnosis not present

## 2022-06-20 DIAGNOSIS — E785 Hyperlipidemia, unspecified: Secondary | ICD-10-CM | POA: Diagnosis not present

## 2022-07-22 ENCOUNTER — Encounter (INDEPENDENT_AMBULATORY_CARE_PROVIDER_SITE_OTHER): Payer: Medicare Other | Admitting: Ophthalmology

## 2022-07-22 DIAGNOSIS — H35033 Hypertensive retinopathy, bilateral: Secondary | ICD-10-CM

## 2022-07-22 DIAGNOSIS — I10 Essential (primary) hypertension: Secondary | ICD-10-CM

## 2022-07-22 DIAGNOSIS — H43813 Vitreous degeneration, bilateral: Secondary | ICD-10-CM | POA: Diagnosis not present

## 2022-07-22 DIAGNOSIS — H353221 Exudative age-related macular degeneration, left eye, with active choroidal neovascularization: Secondary | ICD-10-CM | POA: Diagnosis not present

## 2022-07-22 DIAGNOSIS — H353112 Nonexudative age-related macular degeneration, right eye, intermediate dry stage: Secondary | ICD-10-CM

## 2022-07-24 DIAGNOSIS — E785 Hyperlipidemia, unspecified: Secondary | ICD-10-CM | POA: Diagnosis not present

## 2022-07-24 DIAGNOSIS — M179 Osteoarthritis of knee, unspecified: Secondary | ICD-10-CM | POA: Diagnosis not present

## 2022-07-24 DIAGNOSIS — I10 Essential (primary) hypertension: Secondary | ICD-10-CM | POA: Diagnosis not present

## 2022-07-24 DIAGNOSIS — N182 Chronic kidney disease, stage 2 (mild): Secondary | ICD-10-CM | POA: Diagnosis not present

## 2022-07-24 DIAGNOSIS — K219 Gastro-esophageal reflux disease without esophagitis: Secondary | ICD-10-CM | POA: Diagnosis not present

## 2022-08-02 DIAGNOSIS — M25473 Effusion, unspecified ankle: Secondary | ICD-10-CM | POA: Diagnosis not present

## 2022-08-16 DIAGNOSIS — M179 Osteoarthritis of knee, unspecified: Secondary | ICD-10-CM | POA: Diagnosis not present

## 2022-08-16 DIAGNOSIS — E785 Hyperlipidemia, unspecified: Secondary | ICD-10-CM | POA: Diagnosis not present

## 2022-08-16 DIAGNOSIS — K219 Gastro-esophageal reflux disease without esophagitis: Secondary | ICD-10-CM | POA: Diagnosis not present

## 2022-08-16 DIAGNOSIS — N182 Chronic kidney disease, stage 2 (mild): Secondary | ICD-10-CM | POA: Diagnosis not present

## 2022-08-16 DIAGNOSIS — I10 Essential (primary) hypertension: Secondary | ICD-10-CM | POA: Diagnosis not present

## 2022-08-19 DIAGNOSIS — R600 Localized edema: Secondary | ICD-10-CM | POA: Diagnosis not present

## 2022-08-19 DIAGNOSIS — K219 Gastro-esophageal reflux disease without esophagitis: Secondary | ICD-10-CM | POA: Diagnosis not present

## 2022-08-19 DIAGNOSIS — Z23 Encounter for immunization: Secondary | ICD-10-CM | POA: Diagnosis not present

## 2022-08-19 DIAGNOSIS — I10 Essential (primary) hypertension: Secondary | ICD-10-CM | POA: Diagnosis not present

## 2022-09-02 ENCOUNTER — Encounter (INDEPENDENT_AMBULATORY_CARE_PROVIDER_SITE_OTHER): Payer: Medicare Other | Admitting: Ophthalmology

## 2022-09-02 DIAGNOSIS — H353221 Exudative age-related macular degeneration, left eye, with active choroidal neovascularization: Secondary | ICD-10-CM

## 2022-09-02 DIAGNOSIS — H35033 Hypertensive retinopathy, bilateral: Secondary | ICD-10-CM

## 2022-09-02 DIAGNOSIS — H353112 Nonexudative age-related macular degeneration, right eye, intermediate dry stage: Secondary | ICD-10-CM

## 2022-09-02 DIAGNOSIS — H43813 Vitreous degeneration, bilateral: Secondary | ICD-10-CM | POA: Diagnosis not present

## 2022-09-02 DIAGNOSIS — I10 Essential (primary) hypertension: Secondary | ICD-10-CM

## 2022-09-24 DIAGNOSIS — I10 Essential (primary) hypertension: Secondary | ICD-10-CM | POA: Diagnosis not present

## 2022-09-24 DIAGNOSIS — K219 Gastro-esophageal reflux disease without esophagitis: Secondary | ICD-10-CM | POA: Diagnosis not present

## 2022-09-24 DIAGNOSIS — M179 Osteoarthritis of knee, unspecified: Secondary | ICD-10-CM | POA: Diagnosis not present

## 2022-09-24 DIAGNOSIS — N182 Chronic kidney disease, stage 2 (mild): Secondary | ICD-10-CM | POA: Diagnosis not present

## 2022-09-24 DIAGNOSIS — E785 Hyperlipidemia, unspecified: Secondary | ICD-10-CM | POA: Diagnosis not present

## 2022-10-14 ENCOUNTER — Encounter (INDEPENDENT_AMBULATORY_CARE_PROVIDER_SITE_OTHER): Payer: Medicare Other | Admitting: Ophthalmology

## 2022-10-14 DIAGNOSIS — I10 Essential (primary) hypertension: Secondary | ICD-10-CM

## 2022-10-14 DIAGNOSIS — H353221 Exudative age-related macular degeneration, left eye, with active choroidal neovascularization: Secondary | ICD-10-CM | POA: Diagnosis not present

## 2022-10-14 DIAGNOSIS — H43813 Vitreous degeneration, bilateral: Secondary | ICD-10-CM | POA: Diagnosis not present

## 2022-10-14 DIAGNOSIS — H35033 Hypertensive retinopathy, bilateral: Secondary | ICD-10-CM | POA: Diagnosis not present

## 2022-10-14 DIAGNOSIS — H353112 Nonexudative age-related macular degeneration, right eye, intermediate dry stage: Secondary | ICD-10-CM | POA: Diagnosis not present

## 2022-11-29 ENCOUNTER — Encounter (INDEPENDENT_AMBULATORY_CARE_PROVIDER_SITE_OTHER): Payer: Medicare Other | Admitting: Ophthalmology

## 2022-11-29 DIAGNOSIS — H35033 Hypertensive retinopathy, bilateral: Secondary | ICD-10-CM | POA: Diagnosis not present

## 2022-11-29 DIAGNOSIS — I10 Essential (primary) hypertension: Secondary | ICD-10-CM | POA: Diagnosis not present

## 2022-11-29 DIAGNOSIS — H43813 Vitreous degeneration, bilateral: Secondary | ICD-10-CM | POA: Diagnosis not present

## 2022-11-29 DIAGNOSIS — H353221 Exudative age-related macular degeneration, left eye, with active choroidal neovascularization: Secondary | ICD-10-CM

## 2022-11-29 DIAGNOSIS — H353112 Nonexudative age-related macular degeneration, right eye, intermediate dry stage: Secondary | ICD-10-CM | POA: Diagnosis not present

## 2022-12-24 DIAGNOSIS — K219 Gastro-esophageal reflux disease without esophagitis: Secondary | ICD-10-CM | POA: Diagnosis not present

## 2022-12-24 DIAGNOSIS — R7309 Other abnormal glucose: Secondary | ICD-10-CM | POA: Diagnosis not present

## 2022-12-24 DIAGNOSIS — F5101 Primary insomnia: Secondary | ICD-10-CM | POA: Diagnosis not present

## 2022-12-24 DIAGNOSIS — M179 Osteoarthritis of knee, unspecified: Secondary | ICD-10-CM | POA: Diagnosis not present

## 2022-12-24 DIAGNOSIS — Z0000 Encounter for general adult medical examination without abnormal findings: Secondary | ICD-10-CM | POA: Diagnosis not present

## 2022-12-24 DIAGNOSIS — D696 Thrombocytopenia, unspecified: Secondary | ICD-10-CM | POA: Diagnosis not present

## 2022-12-24 DIAGNOSIS — H3530 Unspecified macular degeneration: Secondary | ICD-10-CM | POA: Diagnosis not present

## 2022-12-24 DIAGNOSIS — I700 Atherosclerosis of aorta: Secondary | ICD-10-CM | POA: Diagnosis not present

## 2022-12-24 DIAGNOSIS — R269 Unspecified abnormalities of gait and mobility: Secondary | ICD-10-CM | POA: Diagnosis not present

## 2022-12-24 DIAGNOSIS — N1831 Chronic kidney disease, stage 3a: Secondary | ICD-10-CM | POA: Diagnosis not present

## 2022-12-24 DIAGNOSIS — I10 Essential (primary) hypertension: Secondary | ICD-10-CM | POA: Diagnosis not present

## 2022-12-24 DIAGNOSIS — E7800 Pure hypercholesterolemia, unspecified: Secondary | ICD-10-CM | POA: Diagnosis not present

## 2023-01-01 ENCOUNTER — Encounter (INDEPENDENT_AMBULATORY_CARE_PROVIDER_SITE_OTHER): Payer: Medicare Other | Admitting: Ophthalmology

## 2023-01-01 DIAGNOSIS — H43813 Vitreous degeneration, bilateral: Secondary | ICD-10-CM | POA: Diagnosis not present

## 2023-01-01 DIAGNOSIS — H35033 Hypertensive retinopathy, bilateral: Secondary | ICD-10-CM

## 2023-01-01 DIAGNOSIS — H353112 Nonexudative age-related macular degeneration, right eye, intermediate dry stage: Secondary | ICD-10-CM

## 2023-01-01 DIAGNOSIS — I10 Essential (primary) hypertension: Secondary | ICD-10-CM | POA: Diagnosis not present

## 2023-01-01 DIAGNOSIS — H353221 Exudative age-related macular degeneration, left eye, with active choroidal neovascularization: Secondary | ICD-10-CM | POA: Diagnosis not present

## 2023-01-03 ENCOUNTER — Encounter (INDEPENDENT_AMBULATORY_CARE_PROVIDER_SITE_OTHER): Payer: Medicare Other | Admitting: Ophthalmology

## 2023-02-05 ENCOUNTER — Encounter (INDEPENDENT_AMBULATORY_CARE_PROVIDER_SITE_OTHER): Payer: Medicare Other | Admitting: Ophthalmology

## 2023-02-05 DIAGNOSIS — H43813 Vitreous degeneration, bilateral: Secondary | ICD-10-CM

## 2023-02-05 DIAGNOSIS — H35033 Hypertensive retinopathy, bilateral: Secondary | ICD-10-CM | POA: Diagnosis not present

## 2023-02-05 DIAGNOSIS — H353112 Nonexudative age-related macular degeneration, right eye, intermediate dry stage: Secondary | ICD-10-CM | POA: Diagnosis not present

## 2023-02-05 DIAGNOSIS — I10 Essential (primary) hypertension: Secondary | ICD-10-CM | POA: Diagnosis not present

## 2023-02-05 DIAGNOSIS — H353221 Exudative age-related macular degeneration, left eye, with active choroidal neovascularization: Secondary | ICD-10-CM | POA: Diagnosis not present

## 2023-02-25 DIAGNOSIS — H353112 Nonexudative age-related macular degeneration, right eye, intermediate dry stage: Secondary | ICD-10-CM | POA: Diagnosis not present

## 2023-02-25 DIAGNOSIS — H353221 Exudative age-related macular degeneration, left eye, with active choroidal neovascularization: Secondary | ICD-10-CM | POA: Diagnosis not present

## 2023-02-25 DIAGNOSIS — Z961 Presence of intraocular lens: Secondary | ICD-10-CM | POA: Diagnosis not present

## 2023-02-25 DIAGNOSIS — R519 Headache, unspecified: Secondary | ICD-10-CM | POA: Diagnosis not present

## 2023-02-25 DIAGNOSIS — H43812 Vitreous degeneration, left eye: Secondary | ICD-10-CM | POA: Diagnosis not present

## 2023-03-11 ENCOUNTER — Encounter (INDEPENDENT_AMBULATORY_CARE_PROVIDER_SITE_OTHER): Payer: Medicare Other | Admitting: Ophthalmology

## 2023-03-11 DIAGNOSIS — I10 Essential (primary) hypertension: Secondary | ICD-10-CM

## 2023-03-11 DIAGNOSIS — H43813 Vitreous degeneration, bilateral: Secondary | ICD-10-CM | POA: Diagnosis not present

## 2023-03-11 DIAGNOSIS — H353221 Exudative age-related macular degeneration, left eye, with active choroidal neovascularization: Secondary | ICD-10-CM

## 2023-03-11 DIAGNOSIS — H35033 Hypertensive retinopathy, bilateral: Secondary | ICD-10-CM | POA: Diagnosis not present

## 2023-03-11 DIAGNOSIS — H353112 Nonexudative age-related macular degeneration, right eye, intermediate dry stage: Secondary | ICD-10-CM

## 2023-03-12 ENCOUNTER — Encounter (INDEPENDENT_AMBULATORY_CARE_PROVIDER_SITE_OTHER): Payer: Medicare Other | Admitting: Ophthalmology

## 2023-03-12 DIAGNOSIS — K5909 Other constipation: Secondary | ICD-10-CM | POA: Diagnosis not present

## 2023-03-12 DIAGNOSIS — K3184 Gastroparesis: Secondary | ICD-10-CM | POA: Diagnosis not present

## 2023-04-15 ENCOUNTER — Encounter (INDEPENDENT_AMBULATORY_CARE_PROVIDER_SITE_OTHER): Payer: Medicare Other | Admitting: Ophthalmology

## 2023-04-15 DIAGNOSIS — H35033 Hypertensive retinopathy, bilateral: Secondary | ICD-10-CM | POA: Diagnosis not present

## 2023-04-15 DIAGNOSIS — I10 Essential (primary) hypertension: Secondary | ICD-10-CM | POA: Diagnosis not present

## 2023-04-15 DIAGNOSIS — H43813 Vitreous degeneration, bilateral: Secondary | ICD-10-CM | POA: Diagnosis not present

## 2023-04-15 DIAGNOSIS — H353112 Nonexudative age-related macular degeneration, right eye, intermediate dry stage: Secondary | ICD-10-CM | POA: Diagnosis not present

## 2023-04-15 DIAGNOSIS — H353221 Exudative age-related macular degeneration, left eye, with active choroidal neovascularization: Secondary | ICD-10-CM | POA: Diagnosis not present

## 2023-05-20 ENCOUNTER — Encounter (INDEPENDENT_AMBULATORY_CARE_PROVIDER_SITE_OTHER): Payer: Medicare Other | Admitting: Ophthalmology

## 2023-05-20 DIAGNOSIS — H353221 Exudative age-related macular degeneration, left eye, with active choroidal neovascularization: Secondary | ICD-10-CM | POA: Diagnosis not present

## 2023-05-20 DIAGNOSIS — H353112 Nonexudative age-related macular degeneration, right eye, intermediate dry stage: Secondary | ICD-10-CM | POA: Diagnosis not present

## 2023-05-20 DIAGNOSIS — I10 Essential (primary) hypertension: Secondary | ICD-10-CM | POA: Diagnosis not present

## 2023-05-20 DIAGNOSIS — H43813 Vitreous degeneration, bilateral: Secondary | ICD-10-CM | POA: Diagnosis not present

## 2023-05-20 DIAGNOSIS — H35033 Hypertensive retinopathy, bilateral: Secondary | ICD-10-CM

## 2023-06-16 DIAGNOSIS — D696 Thrombocytopenia, unspecified: Secondary | ICD-10-CM | POA: Diagnosis not present

## 2023-06-16 DIAGNOSIS — R269 Unspecified abnormalities of gait and mobility: Secondary | ICD-10-CM | POA: Diagnosis not present

## 2023-06-16 DIAGNOSIS — H3530 Unspecified macular degeneration: Secondary | ICD-10-CM | POA: Diagnosis not present

## 2023-06-16 DIAGNOSIS — I10 Essential (primary) hypertension: Secondary | ICD-10-CM | POA: Diagnosis not present

## 2023-06-16 DIAGNOSIS — E7800 Pure hypercholesterolemia, unspecified: Secondary | ICD-10-CM | POA: Diagnosis not present

## 2023-06-16 DIAGNOSIS — M179 Osteoarthritis of knee, unspecified: Secondary | ICD-10-CM | POA: Diagnosis not present

## 2023-06-16 DIAGNOSIS — N1831 Chronic kidney disease, stage 3a: Secondary | ICD-10-CM | POA: Diagnosis not present

## 2023-06-23 ENCOUNTER — Encounter (INDEPENDENT_AMBULATORY_CARE_PROVIDER_SITE_OTHER): Payer: Medicare Other | Admitting: Ophthalmology

## 2023-06-25 ENCOUNTER — Telehealth: Payer: Self-pay | Admitting: Family Medicine

## 2023-06-25 NOTE — Telephone Encounter (Signed)
Thomasene Ripple called and would Lindsey Odom to be est with dr banks and would like something before next year. Please advise

## 2023-06-27 ENCOUNTER — Encounter (INDEPENDENT_AMBULATORY_CARE_PROVIDER_SITE_OTHER): Payer: Medicare Other | Admitting: Ophthalmology

## 2023-06-27 DIAGNOSIS — H43813 Vitreous degeneration, bilateral: Secondary | ICD-10-CM | POA: Diagnosis not present

## 2023-06-27 DIAGNOSIS — H353221 Exudative age-related macular degeneration, left eye, with active choroidal neovascularization: Secondary | ICD-10-CM | POA: Diagnosis not present

## 2023-06-27 DIAGNOSIS — H353112 Nonexudative age-related macular degeneration, right eye, intermediate dry stage: Secondary | ICD-10-CM | POA: Diagnosis not present

## 2023-06-27 DIAGNOSIS — I10 Essential (primary) hypertension: Secondary | ICD-10-CM

## 2023-06-27 DIAGNOSIS — H35033 Hypertensive retinopathy, bilateral: Secondary | ICD-10-CM | POA: Diagnosis not present

## 2023-07-03 NOTE — Telephone Encounter (Signed)
Ok

## 2023-07-09 DIAGNOSIS — M1711 Unilateral primary osteoarthritis, right knee: Secondary | ICD-10-CM | POA: Diagnosis not present

## 2023-07-09 DIAGNOSIS — M170 Bilateral primary osteoarthritis of knee: Secondary | ICD-10-CM | POA: Diagnosis not present

## 2023-07-09 DIAGNOSIS — M1712 Unilateral primary osteoarthritis, left knee: Secondary | ICD-10-CM | POA: Diagnosis not present

## 2023-07-15 DIAGNOSIS — F5101 Primary insomnia: Secondary | ICD-10-CM | POA: Diagnosis not present

## 2023-07-15 DIAGNOSIS — I10 Essential (primary) hypertension: Secondary | ICD-10-CM | POA: Diagnosis not present

## 2023-07-16 NOTE — Telephone Encounter (Signed)
Lmom for ms Hunt to return my call

## 2023-07-16 NOTE — Telephone Encounter (Signed)
Per Lindsey Odom pt no longer want to est with dr banks

## 2023-08-04 ENCOUNTER — Encounter (INDEPENDENT_AMBULATORY_CARE_PROVIDER_SITE_OTHER): Payer: Medicare Other | Admitting: Ophthalmology

## 2023-08-04 DIAGNOSIS — H43813 Vitreous degeneration, bilateral: Secondary | ICD-10-CM

## 2023-08-04 DIAGNOSIS — H35033 Hypertensive retinopathy, bilateral: Secondary | ICD-10-CM | POA: Diagnosis not present

## 2023-08-04 DIAGNOSIS — H353112 Nonexudative age-related macular degeneration, right eye, intermediate dry stage: Secondary | ICD-10-CM | POA: Diagnosis not present

## 2023-08-04 DIAGNOSIS — I10 Essential (primary) hypertension: Secondary | ICD-10-CM | POA: Diagnosis not present

## 2023-08-04 DIAGNOSIS — H353221 Exudative age-related macular degeneration, left eye, with active choroidal neovascularization: Secondary | ICD-10-CM | POA: Diagnosis not present

## 2023-09-15 ENCOUNTER — Encounter (INDEPENDENT_AMBULATORY_CARE_PROVIDER_SITE_OTHER): Payer: Medicare Other | Admitting: Ophthalmology

## 2023-09-15 DIAGNOSIS — H35033 Hypertensive retinopathy, bilateral: Secondary | ICD-10-CM

## 2023-09-15 DIAGNOSIS — H43813 Vitreous degeneration, bilateral: Secondary | ICD-10-CM

## 2023-09-15 DIAGNOSIS — H353221 Exudative age-related macular degeneration, left eye, with active choroidal neovascularization: Secondary | ICD-10-CM

## 2023-09-15 DIAGNOSIS — H353112 Nonexudative age-related macular degeneration, right eye, intermediate dry stage: Secondary | ICD-10-CM

## 2023-09-15 DIAGNOSIS — I10 Essential (primary) hypertension: Secondary | ICD-10-CM | POA: Diagnosis not present

## 2023-10-29 ENCOUNTER — Encounter (INDEPENDENT_AMBULATORY_CARE_PROVIDER_SITE_OTHER): Payer: Medicare Other | Admitting: Ophthalmology

## 2023-10-29 DIAGNOSIS — H35033 Hypertensive retinopathy, bilateral: Secondary | ICD-10-CM | POA: Diagnosis not present

## 2023-10-29 DIAGNOSIS — H43813 Vitreous degeneration, bilateral: Secondary | ICD-10-CM

## 2023-10-29 DIAGNOSIS — I10 Essential (primary) hypertension: Secondary | ICD-10-CM | POA: Diagnosis not present

## 2023-10-29 DIAGNOSIS — H353221 Exudative age-related macular degeneration, left eye, with active choroidal neovascularization: Secondary | ICD-10-CM | POA: Diagnosis not present

## 2023-10-29 DIAGNOSIS — H353112 Nonexudative age-related macular degeneration, right eye, intermediate dry stage: Secondary | ICD-10-CM

## 2023-12-03 ENCOUNTER — Emergency Department (HOSPITAL_COMMUNITY): Payer: Medicare Other

## 2023-12-03 ENCOUNTER — Inpatient Hospital Stay (HOSPITAL_COMMUNITY)
Admission: EM | Admit: 2023-12-03 | Discharge: 2023-12-05 | DRG: 684 | Disposition: A | Discharge status: Home Health | Payer: Medicare Other | Attending: Internal Medicine | Admitting: Internal Medicine

## 2023-12-03 ENCOUNTER — Encounter (HOSPITAL_COMMUNITY): Payer: Self-pay | Admitting: Emergency Medicine

## 2023-12-03 ENCOUNTER — Other Ambulatory Visit: Payer: Self-pay

## 2023-12-03 DIAGNOSIS — I129 Hypertensive chronic kidney disease with stage 1 through stage 4 chronic kidney disease, or unspecified chronic kidney disease: Secondary | ICD-10-CM | POA: Diagnosis present

## 2023-12-03 DIAGNOSIS — M1990 Unspecified osteoarthritis, unspecified site: Secondary | ICD-10-CM | POA: Diagnosis not present

## 2023-12-03 DIAGNOSIS — N179 Acute kidney failure, unspecified: Secondary | ICD-10-CM | POA: Diagnosis not present

## 2023-12-03 DIAGNOSIS — I959 Hypotension, unspecified: Secondary | ICD-10-CM | POA: Diagnosis present

## 2023-12-03 DIAGNOSIS — Z886 Allergy status to analgesic agent status: Secondary | ICD-10-CM

## 2023-12-03 DIAGNOSIS — I499 Cardiac arrhythmia, unspecified: Secondary | ICD-10-CM | POA: Diagnosis not present

## 2023-12-03 DIAGNOSIS — Z91040 Latex allergy status: Secondary | ICD-10-CM | POA: Diagnosis not present

## 2023-12-03 DIAGNOSIS — Z79899 Other long term (current) drug therapy: Secondary | ICD-10-CM

## 2023-12-03 DIAGNOSIS — E7800 Pure hypercholesterolemia, unspecified: Secondary | ICD-10-CM | POA: Diagnosis not present

## 2023-12-03 DIAGNOSIS — Z880 Allergy status to penicillin: Secondary | ICD-10-CM | POA: Diagnosis not present

## 2023-12-03 DIAGNOSIS — E875 Hyperkalemia: Secondary | ICD-10-CM | POA: Diagnosis present

## 2023-12-03 DIAGNOSIS — R6889 Other general symptoms and signs: Secondary | ICD-10-CM | POA: Diagnosis not present

## 2023-12-03 DIAGNOSIS — Z888 Allergy status to other drugs, medicaments and biological substances status: Secondary | ICD-10-CM | POA: Diagnosis not present

## 2023-12-03 DIAGNOSIS — E860 Dehydration: Secondary | ICD-10-CM | POA: Diagnosis present

## 2023-12-03 DIAGNOSIS — Z90710 Acquired absence of both cervix and uterus: Secondary | ICD-10-CM

## 2023-12-03 DIAGNOSIS — G4700 Insomnia, unspecified: Secondary | ICD-10-CM | POA: Diagnosis present

## 2023-12-03 DIAGNOSIS — D696 Thrombocytopenia, unspecified: Secondary | ICD-10-CM | POA: Diagnosis present

## 2023-12-03 DIAGNOSIS — R55 Syncope and collapse: Secondary | ICD-10-CM | POA: Diagnosis not present

## 2023-12-03 DIAGNOSIS — N1831 Chronic kidney disease, stage 3a: Secondary | ICD-10-CM | POA: Diagnosis present

## 2023-12-03 DIAGNOSIS — E876 Hypokalemia: Secondary | ICD-10-CM | POA: Diagnosis not present

## 2023-12-03 DIAGNOSIS — D649 Anemia, unspecified: Secondary | ICD-10-CM | POA: Diagnosis present

## 2023-12-03 DIAGNOSIS — R404 Transient alteration of awareness: Secondary | ICD-10-CM | POA: Diagnosis not present

## 2023-12-03 DIAGNOSIS — I771 Stricture of artery: Secondary | ICD-10-CM | POA: Diagnosis not present

## 2023-12-03 DIAGNOSIS — R4781 Slurred speech: Secondary | ICD-10-CM | POA: Diagnosis not present

## 2023-12-03 DIAGNOSIS — Z743 Need for continuous supervision: Secondary | ICD-10-CM | POA: Diagnosis not present

## 2023-12-03 LAB — BASIC METABOLIC PANEL
Anion gap: 14 (ref 5–15)
BUN: 29 mg/dL — ABNORMAL HIGH (ref 8–23)
CO2: 21 mmol/L — ABNORMAL LOW (ref 22–32)
Calcium: 9.9 mg/dL (ref 8.9–10.3)
Chloride: 104 mmol/L (ref 98–111)
Creatinine, Ser: 1.93 mg/dL — ABNORMAL HIGH (ref 0.44–1.00)
GFR, Estimated: 24 mL/min — ABNORMAL LOW (ref >60)
Glucose, Bld: 116 mg/dL — ABNORMAL HIGH (ref 70–99)
Potassium: 5.2 mmol/L — ABNORMAL HIGH (ref 3.5–5.1)
Sodium: 139 mmol/L (ref 135–145)

## 2023-12-03 LAB — CBC
HCT: 39.5 % (ref 36.0–46.0)
Hemoglobin: 12.3 g/dL (ref 12.0–15.0)
MCH: 30.3 pg (ref 26.0–34.0)
MCHC: 31.1 g/dL (ref 30.0–36.0)
MCV: 97.3 fL (ref 80.0–100.0)
Platelets: 65 10*3/uL — ABNORMAL LOW (ref 150–400)
RBC: 4.06 MIL/uL (ref 3.87–5.11)
RDW: 14.6 % (ref 11.5–15.5)
WBC: 5.6 10*3/uL (ref 4.0–10.5)
nRBC: 0 % (ref 0.0–0.2)

## 2023-12-03 MED ORDER — SODIUM CHLORIDE 0.9 % IV BOLUS
1000.0000 mL | Freq: Once | INTRAVENOUS | Status: AC
  Administered 2023-12-03: 1000 mL via INTRAVENOUS

## 2023-12-03 NOTE — ED Triage Notes (Signed)
Pt arrived via EMS for near syncope and weakness. Per family, pt was sitting on couch when she developed some dysarthria. CBG 165. Hypotension noted on EMS arrival. Dementia at baseline. 500 LR bolus given. SBP > 100 on arrival. No acute neuro deficits present at this time. Pt has hx of HTN, compliant with medications.

## 2023-12-03 NOTE — ED Provider Notes (Signed)
[place] EMERGENCY DEPARTMENT AT [facility] Provider Note  CSN: [id number] Arrival date & time: 12/03/23 2105  Chief Complaint(s) Weakness and Near Syncope  HPI Lindsey Odom is a 89 y.o. female history of CKD, hypertension, lipidemia presenting to the emergency department with syncope.  Patient was at dinner with family, suddenly seem to have trouble speaking, complained of feeling weak, eyes rolled back.  Had a brief episode of loss of consciousness, woke up and then had a recurrent episode.  Currently, patient feels at baseline.  She denies any focal complaints.  Denies any headaches, nausea or vomiting, chest pain, abdominal pain, back pain, painful urination, leg swelling or any other symptoms.  No headaches.  Family report that she does not drink much water.   Past Medical History Past Medical History:  Diagnosis Date   Anxiety    Arthritis    osteoarthritis   Chronic kidney disease    stage 3 kidney disease.    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 Authorizing Provider  acetaminophen (TYLENOL) 500 MG tablet Take 500 mg by mouth every 6 (six) hours as needed for moderate pain or headache.    [provider]  Cholecalciferol (VITAMIN D3) 1000 units CAPS Take 1,000 Units by mouth daily.    [provider]  COD LIVER OIL PO Take 1 capsule by mouth daily.    [provider]  Cyanocobalamin (VITAMIN B-12 PO) Take 2 tablets by mouth daily.    [provider]  dexlansoprazole (DEXILANT) 60 MG capsule Take 60 mg by mouth daily.    [provider]  hydrocortisone cream 1 % Apply 1 application topically 2 (two) times daily as needed  for itching.    [provider]  metoprolol succinate (TOPROL-XL) 50 MG 24 hr tablet Take 50 mg by mouth daily. Take with or immediately following a meal.    [provider]  Multiple Vitamins-Minerals (CENTRUM SILVER PO) Take 1 tablet by mouth daily.    [provider]  Multiple Vitamins-Minerals (PRESERVISION AREDS PO) Take 2 capsules by mouth daily.    [provider]  rosuvastatin (CRESTOR) 10 MG tablet Take 10 mg by mouth daily.    [provider]  sucralfate (CARAFATE) 1 g tablet Take 1 g by mouth at bedtime.    [provider]                                                                                                                                    Past Surgical History Past Surgical History:  Procedure Laterality Date   ABDOMINAL HYSTERECTOMY  ESOPHAGOGASTRODUODENOSCOPY N/A 01/01/2018   Procedure: ESOPHAGOGASTRODUODENOSCOPY (EGD);  Surgeon: Kerin Salen, MD;  Location: [facility];  Service: Gastroenterology;  Laterality: N/A;   Family History History reviewed. No pertinent family history.  Social History Social History   Tobacco Use   Smoking status: Never   Smokeless tobacco: Never  Substance Use Topics   Alcohol use: No   Drug use: No   Allergies Latex, Lisinopril, Aspirin, and Penicillins  Review of Systems Review of Systems  All other systems reviewed and are negative.   Physical Exam Vital Signs  I have reviewed the triage vital signs BP (!) 126/96   Pulse (!) 53   Temp 98.4 F (36.9 C) (Oral)   Resp 15   SpO2 94%  Physical Exam Vitals and nursing note reviewed.  Constitutional:      General: She is not in acute distress.    Appearance: She is well-developed.  HENT:     Head: Normocephalic and atraumatic.     Mouth/Throat:     Mouth: Mucous membranes are dry.  Eyes:     Pupils: Pupils are equal, round, and reactive to light.  Cardiovascular:     Rate and Rhythm: Normal rate and regular  rhythm.     Heart sounds: No murmur heard. Pulmonary:     Effort: Pulmonary effort is normal. No respiratory distress.     Breath sounds: Normal breath sounds.  Abdominal:     General: Abdomen is flat.     Palpations: Abdomen is soft.     Tenderness: There is no abdominal tenderness.  Musculoskeletal:        General: No tenderness.     Right lower leg: No edema.     Left lower leg: No edema.  Skin:    General: Skin is warm and dry.  Neurological:     General: No focal deficit present.     Mental Status: She is alert. Mental status is at baseline.     Comments: Oriented to self, knows it is Christmas, knows why she is here  Psychiatric:        Mood and Affect: Mood normal.        Behavior: Behavior normal.     ED Results and Treatments Labs (all labs ordered are listed, but only abnormal results are displayed) Labs Reviewed  BASIC METABOLIC PANEL - Abnormal; Notable for the following components:      Result Value   Potassium 5.2 (*)    CO2 21 (*)    Glucose, Bld 116 (*)    BUN 29 (*)    Creatinine, Ser 1.93 (*)    GFR, Estimated 24 (*)    All other components within normal limits  CBC - Abnormal; Notable for the following components:   Platelets 65 (*)    All other components within normal limits  URINALYSIS, ROUTINE W REFLEX MICROSCOPIC  CBG MONITORING, ED  Radiology DG Chest Port 1 View Result Date: 12/03/2023 CLINICAL DATA:  Syncope EXAM: PORTABLE CHEST 1 VIEW COMPARISON:  03/05/2018 FINDINGS: Heart is borderline in size. Tortuous aorta. No confluent airspace opacities or effusions. No acute bony abnormality. IMPRESSION: No active disease. Electronically Signed   By: Charlett Nose M.D.   On: 12/03/2023 23:25    Pertinent labs & imaging results that were available during my care of the patient were reviewed by me and considered in my medical  decision making (see MDM for details).  Medications Ordered in ED Medications  sodium chloride 0.9 % bolus 1,000 mL (1,000 mLs Intravenous New Bag/Given 12/03/23 2220)                                                                                                                                     Procedures Procedures  (including critical care time)  Medical Decision Making / ED Course   MDM:  89 year old female presenting to the emergency department syncopal episode.  Patient well-appearing overall, a physical exam is unremarkable other than slightly dry mucous membranes.  Given age, syncope is somewhat concerning, suspect most likely causes possibly dehydration given acute kidney injury on laboratory testing.  Most recent creatinine in the system was around 1.2, today 1.9 with very mild hyperkalemia.  Family reports she does not drink much water.  Lower concern for cardiac cause with no chest pain or shortness of breath, EKG without evidence of STEMI or arrhythmia, but will be monitored on telemetry.  Patient at her baseline mental status and has no acute focal deficit.  Low concern for stroke or intracranial process.  She has no headaches or any other neurologic symptoms.  Pending urinalysis to evaluate for any occult UTI.  Will obtain chest x-ray.  Given evidence of dehydration on labs discussed with hospitalist Dr. Avie Arenas who will admit the patient for further monitoring.      Additional history obtained: -Additional history obtained from family and ems -External records from outside source obtained and reviewed including: Chart review including previous notes, labs, imaging, consultation notes including prior lab results    Lab Tests: -I ordered, reviewed, and interpreted labs.   The pertinent results include:   Labs Reviewed  BASIC METABOLIC PANEL - Abnormal; Notable for the following components:      Result Value   Potassium 5.2 (*)    CO2 21 (*)    Glucose, Bld 116  (*)    BUN 29 (*)    Creatinine, Ser 1.93 (*)    GFR, Estimated 24 (*)    All other components within normal limits  CBC - Abnormal; Notable for the following components:   Platelets 65 (*)    All other components within normal limits  URINALYSIS, ROUTINE W REFLEX MICROSCOPIC  CBG MONITORING, ED    Notable for AKI   EKG   EKG Interpretation Date/Time:  Wednesday December 03 2023 21:13:04 EST  Ventricular Rate:  84 PR Interval:    QRS Duration:  75 QT Interval:  371 QTC Calculation: 439 R Axis:   -11  Text Interpretation: Normal sinus rhythm Ventricular premature complex Abnormal R-wave progression, early transition LVH with secondary repolarization abnormality Reconfirmed by Alvino Blood ([id number]) on 12/03/2023 11:32:37 PM         Imaging Studies ordered: I ordered imaging studies including CXR On my interpretation imaging demonstrates no acute process I independently visualized and interpreted imaging. I agree with the radiologist interpretation   Medicines ordered and prescription drug management: Meds ordered this encounter  Medications   sodium chloride 0.9 % bolus 1,000 mL    -I have reviewed the patients home medicines and have made adjustments as needed   Consultations Obtained: I requested consultation with the hospitalist,  and discussed lab and imaging findings as well as pertinent plan - they recommend: admission   Cardiac Monitoring: The patient was maintained on a cardiac monitor.  I personally viewed and interpreted the cardiac monitored which showed an underlying rhythm of: NSR     Reevaluation: After the interventions noted above, I reevaluated the patient and found that their symptoms have improved  Co morbidities that complicate the patient evaluation  Past Medical History:  Diagnosis Date   Anxiety    Arthritis    osteoarthritis   Chronic kidney disease    stage 3 kidney disease.    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    Final Clinical Impression(s) / ED Diagnoses Final diagnoses:  Syncope and collapse     This chart was dictated using voice recognition software.  Despite best efforts to proofread,  errors can occur which can change the documentation meaning.    Lonell Grandchild, MD 12/03/23 [phone]

## 2023-12-04 ENCOUNTER — Observation Stay (HOSPITAL_COMMUNITY): Payer: Medicare Other

## 2023-12-04 ENCOUNTER — Encounter (HOSPITAL_COMMUNITY): Payer: Self-pay | Admitting: Internal Medicine

## 2023-12-04 ENCOUNTER — Other Ambulatory Visit: Payer: Self-pay

## 2023-12-04 DIAGNOSIS — D696 Thrombocytopenia, unspecified: Secondary | ICD-10-CM | POA: Diagnosis present

## 2023-12-04 DIAGNOSIS — Z888 Allergy status to other drugs, medicaments and biological substances status: Secondary | ICD-10-CM | POA: Diagnosis not present

## 2023-12-04 DIAGNOSIS — R55 Syncope and collapse: Secondary | ICD-10-CM | POA: Diagnosis not present

## 2023-12-04 DIAGNOSIS — I129 Hypertensive chronic kidney disease with stage 1 through stage 4 chronic kidney disease, or unspecified chronic kidney disease: Secondary | ICD-10-CM | POA: Diagnosis present

## 2023-12-04 DIAGNOSIS — G319 Degenerative disease of nervous system, unspecified: Secondary | ICD-10-CM | POA: Diagnosis not present

## 2023-12-04 DIAGNOSIS — Z880 Allergy status to penicillin: Secondary | ICD-10-CM | POA: Diagnosis not present

## 2023-12-04 DIAGNOSIS — N1831 Chronic kidney disease, stage 3a: Secondary | ICD-10-CM | POA: Diagnosis present

## 2023-12-04 DIAGNOSIS — Z471 Aftercare following joint replacement surgery: Secondary | ICD-10-CM | POA: Diagnosis not present

## 2023-12-04 DIAGNOSIS — E7800 Pure hypercholesterolemia, unspecified: Secondary | ICD-10-CM | POA: Diagnosis present

## 2023-12-04 DIAGNOSIS — Z90710 Acquired absence of both cervix and uterus: Secondary | ICD-10-CM | POA: Diagnosis not present

## 2023-12-04 DIAGNOSIS — D649 Anemia, unspecified: Secondary | ICD-10-CM | POA: Diagnosis present

## 2023-12-04 DIAGNOSIS — G4700 Insomnia, unspecified: Secondary | ICD-10-CM | POA: Diagnosis present

## 2023-12-04 DIAGNOSIS — Z91040 Latex allergy status: Secondary | ICD-10-CM | POA: Diagnosis not present

## 2023-12-04 DIAGNOSIS — N179 Acute kidney failure, unspecified: Secondary | ICD-10-CM | POA: Diagnosis present

## 2023-12-04 DIAGNOSIS — E875 Hyperkalemia: Secondary | ICD-10-CM | POA: Diagnosis present

## 2023-12-04 DIAGNOSIS — E860 Dehydration: Secondary | ICD-10-CM | POA: Diagnosis present

## 2023-12-04 DIAGNOSIS — Z886 Allergy status to analgesic agent status: Secondary | ICD-10-CM | POA: Diagnosis not present

## 2023-12-04 DIAGNOSIS — E876 Hypokalemia: Secondary | ICD-10-CM | POA: Diagnosis not present

## 2023-12-04 DIAGNOSIS — R531 Weakness: Secondary | ICD-10-CM | POA: Diagnosis not present

## 2023-12-04 DIAGNOSIS — M1990 Unspecified osteoarthritis, unspecified site: Secondary | ICD-10-CM | POA: Diagnosis present

## 2023-12-04 DIAGNOSIS — Z79899 Other long term (current) drug therapy: Secondary | ICD-10-CM | POA: Diagnosis not present

## 2023-12-04 DIAGNOSIS — I959 Hypotension, unspecified: Secondary | ICD-10-CM | POA: Diagnosis present

## 2023-12-04 LAB — BASIC METABOLIC PANEL WITH GFR
Anion gap: 10 (ref 5–15)
BUN: 26 mg/dL — ABNORMAL HIGH (ref 8–23)
CO2: 24 mmol/L (ref 22–32)
Calcium: 9.2 mg/dL (ref 8.9–10.3)
Chloride: 110 mmol/L (ref 98–111)
Creatinine, Ser: 1.78 mg/dL — ABNORMAL HIGH (ref 0.44–1.00)
GFR, Estimated: 26 mL/min — ABNORMAL LOW
Glucose, Bld: 99 mg/dL (ref 70–99)
Potassium: 3.5 mmol/L (ref 3.5–5.1)
Sodium: 144 mmol/L (ref 135–145)

## 2023-12-04 LAB — URINALYSIS, ROUTINE W REFLEX MICROSCOPIC
Bilirubin Urine: NEGATIVE
Bilirubin Urine: NEGATIVE
Glucose, UA: NEGATIVE mg/dL
Glucose, UA: NEGATIVE mg/dL
Hgb urine dipstick: NEGATIVE
Hgb urine dipstick: NEGATIVE
Ketones, ur: NEGATIVE mg/dL
Ketones, ur: NEGATIVE mg/dL
Nitrite: NEGATIVE
Nitrite: POSITIVE — AB
Protein, ur: NEGATIVE mg/dL
Protein, ur: NEGATIVE mg/dL
Specific Gravity, Urine: 1.01 (ref 1.005–1.030)
Specific Gravity, Urine: 1.004 — ABNORMAL LOW (ref 1.005–1.030)
pH: 5 (ref 5.0–8.0)
pH: 6 (ref 5.0–8.0)

## 2023-12-04 LAB — CBC
HCT: 31.9 % — ABNORMAL LOW (ref 36.0–46.0)
Hemoglobin: 9.9 g/dL — ABNORMAL LOW (ref 12.0–15.0)
MCH: 30 pg (ref 26.0–34.0)
MCHC: 31 g/dL (ref 30.0–36.0)
MCV: 96.7 fL (ref 80.0–100.0)
Platelets: 90 10*3/uL — ABNORMAL LOW (ref 150–400)
RBC: 3.3 MIL/uL — ABNORMAL LOW (ref 3.87–5.11)
RDW: 14.4 % (ref 11.5–15.5)
WBC: 5 10*3/uL (ref 4.0–10.5)
nRBC: 0 % (ref 0.0–0.2)

## 2023-12-04 LAB — ECHOCARDIOGRAM COMPLETE
AR max vel: 1.59 cm2
AV Area VTI: 1.75 cm2
AV Area mean vel: 1.6 cm2
AV Mean grad: 6 mmHg
AV Peak grad: 10.5 mmHg
Ao pk vel: 1.62 m/s
Area-P 1/2: 3.99 cm2
Height: 62 in
P 1/2 time: 605 ms
S' Lateral: 1.6 cm
Weight: 1841.28 [oz_av]

## 2023-12-04 MED ORDER — SPIRONOLACTONE 12.5 MG HALF TABLET
12.5000 mg | ORAL_TABLET | Freq: Every morning | ORAL | Status: DC
  Administered 2023-12-04: 12.5 mg via ORAL
  Filled 2023-12-04: qty 1

## 2023-12-04 MED ORDER — OXYCODONE HCL 5 MG PO TABS: 5.0000 mg | ORAL_TABLET | ORAL | Status: DC | PRN

## 2023-12-04 MED ORDER — ACETAMINOPHEN 325 MG PO TABS
650.0000 mg | ORAL_TABLET | Freq: Four times a day (QID) | ORAL | Status: DC | PRN
  Administered 2023-12-05: 650 mg via ORAL
  Filled 2023-12-04: qty 2

## 2023-12-04 MED ORDER — LACTATED RINGERS IV SOLN: INTRAVENOUS | Status: AC

## 2023-12-04 MED ORDER — ONDANSETRON HCL 4 MG/2ML IJ SOLN: 4.0000 mg | Freq: Four times a day (QID) | INTRAMUSCULAR | Status: DC | PRN

## 2023-12-04 MED ORDER — ACETAMINOPHEN 650 MG RE SUPP: 650.0000 mg | Freq: Four times a day (QID) | RECTAL | Status: DC | PRN

## 2023-12-04 MED ORDER — LOSARTAN POTASSIUM-HCTZ 100-25 MG PO TABS: 1.0000 | ORAL_TABLET | Freq: Every day | ORAL | Status: DC

## 2023-12-04 MED ORDER — LACTATED RINGERS IV SOLN: INTRAVENOUS | Status: DC

## 2023-12-04 MED ORDER — ONDANSETRON HCL 4 MG PO TABS
4.0000 mg | ORAL_TABLET | Freq: Four times a day (QID) | ORAL | Status: DC | PRN
Start: 2023-12-04 — End: 2023-12-05

## 2023-12-04 MED ORDER — ENOXAPARIN SODIUM 30 MG/0.3ML IJ SOSY
30.0000 mg | PREFILLED_SYRINGE | INTRAMUSCULAR | Status: DC
  Administered 2023-12-04 – 2023-12-05 (×2): 30 mg via SUBCUTANEOUS
  Filled 2023-12-04 (×2): qty 0.3

## 2023-12-04 MED ORDER — TRAZODONE HCL 50 MG PO TABS
50.0000 mg | ORAL_TABLET | Freq: Every evening | ORAL | Status: DC | PRN
  Administered 2023-12-04: 50 mg via ORAL
  Filled 2023-12-04: qty 1

## 2023-12-04 MED ORDER — ROSUVASTATIN CALCIUM 5 MG PO TABS
10.0000 mg | ORAL_TABLET | Freq: Every day | ORAL | Status: DC
Start: 2023-12-04 — End: 2023-12-05
  Administered 2023-12-04 – 2023-12-05 (×2): 10 mg via ORAL
  Filled 2023-12-04 (×2): qty 2

## 2023-12-04 MED ORDER — METOPROLOL SUCCINATE ER 50 MG PO TB24
50.0000 mg | ORAL_TABLET | Freq: Every day | ORAL | Status: DC
Start: 2023-12-04 — End: 2023-12-05
  Administered 2023-12-04 – 2023-12-05 (×2): 50 mg via ORAL
  Filled 2023-12-04 (×2): qty 1

## 2023-12-04 MED ORDER — ORAL CARE MOUTH RINSE: 15.0000 mL | OROMUCOSAL | Status: DC | PRN

## 2023-12-04 NOTE — Progress Notes (Signed)
  Echocardiogram 2D Echocardiogram has been performed.  Lindsey Odom 12/04/2023, 9:03 AM

## 2023-12-04 NOTE — Progress Notes (Signed)
New Admission Note:   Arrival Method: Via Stretcher from ED Mental Orientation:  A & O x4 Telemetry: Box 5M04 Assessment: Completed Skin:  Skin Dry and Flaky - Stage II to Left Buttock 0.5 x 0.5 x .1 IV:  Rt AC PIV Pain: Denies Tubes:  Purwick per family request Safety Measures: Safety Fall Prevention Plan has been given, discussed and signed Admission: Completed 5 MW Orientation: Patient has been orientated to the room, unit and staff.  Family:  Daughter is at bedside  Patient lives at home with her daughter.  She wears glasses and bilateral hearing aids.  She also has upper and lower dentures.  All assistive devices are at the patient's bedside.  Her family assists patient with ADL and drives her to all of her appointments.    Orders have been reviewed and implemented. Will continue to monitor the patient. Call light has been placed within reach and bed alarm has been activated.   Bernie Covey RN Phone number: [phone]

## 2023-12-04 NOTE — ED Notes (Signed)
Patient transported to MRI 

## 2023-12-04 NOTE — Plan of Care (Signed)
  Problem: Health Behavior/Discharge Planning: Goal: Ability to manage health-related needs will improve Outcome: Progressing   

## 2023-12-04 NOTE — H&P (Signed)
History and Physical    Lindsey Odom MWU:[id number] DOB: 26-Nov-1926 DOA: 12/03/2023  PCP: Georgann Housekeeper, MD   Chief Complaint: Syncope  HPI: Lindsey Odom is a 89 y.o. female with medical history significant of pretension, CKD, hyperlipidemia who presents emergency department syncopal episode.  Patient was at dinner when he suddenly developed dysarthria weakness and loss of consciousness.  Patient then returned to his baseline.  Family was concerned he was dehydrated so brought him to the ER for further assessment.  On arrival he was afebrile hemodynamically stable.  He is only oriented to self.  Labs were in which showed potassium 5.2, creatinine 1.9 baseline around 1, WBC 5.6, hemoglobin 12.3, platelets 65,000, urinalysis pending.  Patient had chest x-ray which showed no evidence of infection.  Patient was given IV fluids and admitted for further workup of syncope.   Review of Systems: Review of Systems  Constitutional: Negative.  Negative for chills and fever.  HENT: Negative.    Eyes: Negative.   Respiratory: Negative.    Cardiovascular: Negative.   Gastrointestinal: Negative.   Genitourinary: Negative.   Musculoskeletal: Negative.   Skin: Negative.   Neurological: Negative.   Endo/Heme/Allergies: Negative.   Psychiatric/Behavioral: Negative.       As per HPI otherwise 10 point review of systems negative.   Allergies  Allergen Reactions   Amitriptyline     Jittery   Atorvastatin     Lip swelling   Celecoxib     angioedema   Latex Itching   Lisinopril Other (See Comments)    Rash inside mouth, swelling of lips   Aspirin Rash   Penicillins Rash and Other (See Comments)    Has patient had a PCN reaction causing immediate rash, facial/tongue/throat swelling, SOB or lightheadedness with hypotension: Unknown Has patient had a PCN reaction causing severe rash involving mucus membranes or skin necrosis:Yes  Has patient had a PCN reaction that required hospitalization:  No Has patient had a PCN reaction occurring within the last 10 years: No If all of the above answers are "NO", then may proceed with Cephalosporin use.     Past Medical History:  Diagnosis Date   Anxiety    Arthritis    osteoarthritis   Chronic kidney disease    stage 3 kidney disease.    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 She has never used smokeless tobacco. She reports that she does not drink alcohol and does not use drugs.  History reviewed. No pertinent family history.  Prior to Admission medications   Medication Sig Start Date End Date Taking? Authorizing Provider  acetaminophen (TYLENOL) 650 MG CR tablet Take 1,300 mg by mouth every 8 (eight) hours as needed for pain.   Yes [provider]  Ascorbic Acid (VITAMIN C) 1000 MG tablet Take 1,000 mg by mouth daily.   Yes [provider]  Bee Pollen 580 MG CAPS Take 1 capsule by mouth daily.   Yes [provider]  Cholecalciferol (VITAMIN D3) 1000 units CAPS Take 1,000 Units by mouth daily.   Yes [provider]  COD LIVER OIL PO Take 1 capsule by mouth daily.   Yes [provider]  Cyanocobalamin (VITAMIN B-12 PO) Take 1 tablet by mouth daily.   Yes [provider]  erythromycin (E-MYCIN) 250 MG tablet Take 250 mg by mouth 2 (two) times daily.   Yes [provider]  hydrocortisone cream 1 % Apply 1 application topically 2 (two) times daily as needed for itching.   Yes [provider]  losartan-hydrochlorothiazide (HYZAAR) 100-25  MG tablet Take 1 tablet by mouth daily.   Yes [provider]  metoprolol succinate (TOPROL-XL) 50 MG 24 hr tablet Take 50 mg by mouth daily. Take with or immediately following a meal.   Yes [provider]  Multiple Vitamins-Minerals (PRESERVISION AREDS PO) Take 2 capsules by mouth daily.   Yes [provider]  Potassium 99 MG TABS Take 2 tablets by mouth daily.   Yes [provider]  rosuvastatin (CRESTOR) 10 MG tablet Take 10 mg by mouth daily.   Yes [provider]  spironolactone (ALDACTONE) 25 MG tablet Take 12.5 mg by mouth every morning.   Yes [provider]  temazepam (RESTORIL) 15 MG capsule Take 15 mg by mouth at bedtime as needed. 11/11/23  Yes [provider]  traZODone (DESYREL) 50 MG tablet 1 tablet at bedtime as needed Orally At bedtime for 30 days 06/16/23  Yes [provider]    Physical Exam: Vitals:   12/03/23 2230 12/03/23 2245 12/03/23 2300 12/03/23 2315  BP: (!) 128/112 106/65 112/87 (!) 126/96  Pulse:      Resp: 17 18 17 15   Temp:      TempSrc:      SpO2:       Physical Exam Constitutional:      Appearance: She is normal weight.  HENT:     Head: Normocephalic.     Nose: Nose normal.     Mouth/Throat:     Mouth: Mucous membranes are moist.     Pharynx: Oropharynx is clear.  Eyes:     Conjunctiva/sclera: Conjunctivae normal.     Pupils: Pupils are equal, round, and reactive to light.  Cardiovascular:     Rate and Rhythm: Normal rate and regular rhythm.     Pulses: Normal pulses.     Heart sounds: Normal heart sounds.  Pulmonary:     Effort: Pulmonary effort is normal.     Breath sounds: Normal breath sounds.  Abdominal:     General: Abdomen is flat. Bowel sounds are normal.  Musculoskeletal:        General: Normal range of motion.     Cervical back: Normal range of motion.  Skin:    General: Skin is warm.     Capillary Refill: Capillary refill takes less than 2 seconds.   Neurological:     General: No focal deficit present.     Mental Status: She is alert.  Psychiatric:        Mood and Affect: Mood normal.        Labs on Admission: I have personally reviewed the patients's labs and imaging studies.  Assessment/Plan Principal Problem:   Syncope   # Syncopal episode - Differential includes vasovagal in setting of dehydration, TIA due to sudden loss of consciousness without prodromal state or cardiogenic -Differential diagnosis most likely secondary to dehydration due to elevated creatinine and reported poor p.o. intake  Plan: Continue IV fluids Obtain echocardiogram Obtain MRI brain  # AKI-likely prerenal in setting of poor  p.o. intake.  Will continue IV fluids and hold losartan/hydrochlorothiazide.  If labs are better in the morning medication can be resumed  # Hypertension-continue metoprolol, spironolactone  # Hyperlipidemia-continue rosuvastatin  # Insomnia-continue trazodone   Admission status: Observation Telemetry Medical  Certification: The appropriate patient status for this patient is OBSERVATION. Observation status is judged to be reasonable and necessary in order to provide the required intensity of service to ensure the patient's safety. The patient's presenting symptoms, physical exam findings, and initial radiographic and laboratory data in the context of their medical condition is felt to place them at decreased risk for further clinical deterioration. Furthermore, it is anticipated that the patient will be medically stable for discharge from the hospital within 2 midnights of admission.     Alan Mulder MD [facility] If 7PM-7AM, please contact night-coverage [email or web address]  12/04/2023, 12:34 AM

## 2023-12-04 NOTE — Progress Notes (Signed)
[facility] PROGRESS NOTE  Lindsey Odom (DOB: [birth year]) OZH:[id number] PCP: Georgann Housekeeper, MD  Brief Narrative: Lindsey Odom is a 89 y.o. female with a history of HTN, CKD, HLD who presented to the ED on 12/03/2023 after an episode of syncope/disorientation. She was given IVF for hypotension and AKI, admitted earlier today by Dr. Avie Arenas for syncope.   Subjective: Feels ok, hasn't eaten anything yet today, hasn't gotten up at all today. No complaints. Daughter and other family at bedside.  Objective: BP 117/75   Pulse 82   Temp 98 F (36.7 C)   Resp 18   Ht 5\' 2"  (1.575 m)   Wt 52.2 kg   SpO2 94%   BMI 21.05 kg/m   Gen: Elderly lively female in no distress Pulm: Clear, nonlabored  CV: RRR, no MRG, trace edema (which pt/family report is much less than previous) GI: Soft, NT, ND, +BS  Neuro: Alert and interactive, MAEW. Ext: Warm, no deformities, dry   Assessment & Plan: Syncope: Transient episode with disorientation, no seizure like activity or hx seizures, no focal deficits noted by bystanders/EMS though did have nonsensical speech. MRI brain negative for acute stroke.  - Echo shows LVEF 70-75%, no RWMA, G1DD, normal size and function of RV with elevated PASP, mild MR, mod TR. Will keep on cardiac monitoring while admitted.  - Treat intravascular volume depletion.   AKI on stage IIIa CKD: Creatinine improved with IVF but oral intake has been insufficient thus far.  - Still feeling weak. Will get PT/OT - Will hold ARB, thiazide, spironolactone (this was given this AM)   - Continue metoprolol - Encourage adequate po intake, will complete this bag of IVF, give antiemetics prn, and push po.  - Renal U/S ordered >> shows normal echogenicity not suggestive of chronic medical renal disease, and no hydronephrosis.   HTN:  - Adjusting BP medications for now as above, will need close PCP follow up.   HLD:  - Continue statin  Thrombocytopenia: Chronic  Normocytic  anemia: No overt bleeding, though hgb 12.3 > 9.9.  - Recheck in AM to confirm stability, or again today if bleeding develops. Send anemia panel.   Hyperkalemia:  - Hold spironolactone, ARB, and K supplement  Insomnia:  - Continue prn trazodone  Tyrone Nine, MD [facility] [email or web address] 12/04/2023, 12:39 PM

## 2023-12-05 DIAGNOSIS — R55 Syncope and collapse: Secondary | ICD-10-CM | POA: Diagnosis not present

## 2023-12-05 LAB — RENAL FUNCTION PANEL
Albumin: 2.9 g/dL — ABNORMAL LOW (ref 3.5–5.0)
Anion gap: 8 (ref 5–15)
BUN: 14 mg/dL (ref 8–23)
CO2: 25 mmol/L (ref 22–32)
Calcium: 9.1 mg/dL (ref 8.9–10.3)
Chloride: 110 mmol/L (ref 98–111)
Creatinine, Ser: 1.27 mg/dL — ABNORMAL HIGH (ref 0.44–1.00)
GFR, Estimated: 39 mL/min — ABNORMAL LOW (ref >60)
Glucose, Bld: 91 mg/dL (ref 70–99)
Phosphorus: 2.7 mg/dL (ref 2.5–4.6)
Potassium: 3.4 mmol/L — ABNORMAL LOW (ref 3.5–5.1)
Sodium: 143 mmol/L (ref 135–145)

## 2023-12-05 LAB — CBC
HCT: 31 % — ABNORMAL LOW (ref 36.0–46.0)
Hemoglobin: 9.9 g/dL — ABNORMAL LOW (ref 12.0–15.0)
MCH: 30.4 pg (ref 26.0–34.0)
MCHC: 31.9 g/dL (ref 30.0–36.0)
MCV: 95.1 fL (ref 80.0–100.0)
Platelets: 93 10*3/uL — ABNORMAL LOW (ref 150–400)
RBC: 3.26 MIL/uL — ABNORMAL LOW (ref 3.87–5.11)
RDW: 14.3 % (ref 11.5–15.5)
WBC: 6 10*3/uL (ref 4.0–10.5)
nRBC: 0 % (ref 0.0–0.2)

## 2023-12-05 LAB — IRON AND TIBC
Iron: 54 ug/dL (ref 28–170)
Saturation Ratios: 20 % (ref 10.4–31.8)
TIBC: 267 ug/dL (ref 250–450)
UIBC: 213 ug/dL

## 2023-12-05 LAB — RETICULOCYTES
Immature Retic Fract: 11 % (ref 2.3–15.9)
RBC.: 3.22 MIL/uL — ABNORMAL LOW (ref 3.87–5.11)
Retic Count, Absolute: 50.2 10*3/uL (ref 19.0–186.0)
Retic Ct Pct: 1.6 % (ref 0.4–3.1)

## 2023-12-05 LAB — FOLATE: Folate: 11.7 ng/mL (ref >5.9)

## 2023-12-05 LAB — FERRITIN: Ferritin: 144 ng/mL (ref 11–307)

## 2023-12-05 LAB — VITAMIN B12: Vitamin B-12: 3752 pg/mL — ABNORMAL HIGH (ref 180–914)

## 2023-12-05 MED ORDER — POTASSIUM CHLORIDE CRYS ER 20 MEQ PO TBCR
40.0000 meq | EXTENDED_RELEASE_TABLET | Freq: Once | ORAL | Status: AC
  Administered 2023-12-05: 40 meq via ORAL
  Filled 2023-12-05: qty 2

## 2023-12-05 NOTE — TOC Transition Note (Signed)
Transition of Care [facility]) - Discharge Note   Patient Details  Name: Lindsey Odom MRN: [id number] Date of Birth: [birth year]  [facility] CM/SW Contact:  Gordy Clement, RN Phone Number: 12/05/2023, 11:47 AM   Clinical Narrative:     Patient will dc to home today . Patient will receive HH from [facility]. [name] has been requested to contact Daughter to arrange HH : Dr Tobe Sos @ [phone]. Patient lives with this Daughter   No DME has been recommended. Daughter to transport home            Patient Goals and CMS Choice            Discharge Placement                       Discharge Plan and Services Additional resources added to the After Visit Summary for                                       Social Drivers of Health (SDOH) Interventions SDOH Screenings   Food Insecurity: No Food Insecurity (12/04/2023)  Housing: Low Risk  (12/04/2023)  Transportation Needs: No Transportation Needs (12/04/2023)  Utilities: Not At Risk (12/04/2023)  Tobacco Use: Low Risk  (12/04/2023)     Readmission Risk Interventions     No data to display

## 2023-12-05 NOTE — Evaluation (Signed)
Physical Therapy Evaluation Patient Details Name: Lindsey Odom MRN: [id number] DOB: 1926-02-15 Today's Date: 12/05/2023  History of Present Illness  The pt is a 89 yo female presenting 11/25 with syncope and weakness, family reports x2 syncopal episodes. PMH includes: dementia, HTN, arthritis, and CKD III.   Clinical Impression  Pt in bed upon arrival of PT, agreeable to evaluation at this time. Prior to admission the pt was independent with use of rollator in her home, reports no falls and is typically able to ambulate to bathroom and manage ADLs independently. The pt does present with deficits in LE strength, power, and endurance, as well as limited activity tolerance. The pt reports spending most of each day in bed, which likely accounts for baseline frailty. The pt Was able to complete sit-stand with minA from elevated EOB, and complete short-distance ambulation in the room with use of RW and minA with no buckling or LOB. Recommend HHPT to maximize safety with mobility at home and progress LE strength and power for improved independence with sit-stand transfers in the home.      If plan is discharge home, recommend the following: A little help with walking and/or transfers;A little help with bathing/dressing/bathroom;Assist for transportation;Help with stairs or ramp for entrance   Can travel by private vehicle        Equipment Recommendations None recommended by PT  Recommendations for Other Services  OT consult    Functional Status Assessment Patient has had a recent decline in their functional status and demonstrates the ability to make significant improvements in function in a reasonable and predictable amount of time.     Precautions / Restrictions Precautions Precautions: Fall Restrictions Weight Bearing Restrictions Per Provider Order: No      Mobility  Bed Mobility Overal bed mobility: Needs Assistance Bed Mobility: Supine to Sit     Supine to sit: Supervision           Transfers Overall transfer level: Needs assistance   Transfers: Sit to/from Stand Sit to Stand: Min assist           General transfer comment: minA from elevated bed. poor power in LE    Ambulation/Gait Ambulation/Gait assistance: Min assist Gait Distance (Feet): 15 Feet Assistive device: Rolling walker (2 wheels) Gait Pattern/deviations: Step-through pattern, Decreased stride length Gait velocity: decreased Gait velocity interpretation: <1.31 ft/sec, indicative of household ambulator   General Gait Details: pt with no overt buckling or LOB, limited to within-room distance by fatigue. VSS     Balance Overall balance assessment: Mild deficits observed, not formally tested                                           Pertinent Vitals/Pain Pain Assessment Pain Assessment: Faces Faces Pain Scale: Hurts even more Pain Location: bilateral knees (chronic arthritis) Pain Descriptors / Indicators: Discomfort, Grimacing Pain Intervention(s): Limited activity within patient's tolerance, Monitored during session, Repositioned    Home Living Family/patient expects to be discharged to:: Private residence Living Arrangements: Children Available Help at Discharge: Family;Available 24 hours/day Type of Home: House Home Access: Stairs to enter Entrance Stairs-Rails: Doctor, general practice of Steps: 2   Home Layout: Multi-level;Able to live on main level with bedroom/bathroom Home Equipment: Rollator (4 wheels);Grab bars - tub/shower;Hand held shower head Additional Comments: pt from home with daughter, is not left alone. pt reports most of her time is spent  in bed    Prior Function Prior Level of Function : Needs assist       Physical Assist : Mobility (physical);ADLs (physical) Mobility (physical): Gait;Stairs   Mobility Comments: pt mobilizing with Rollator, no falls ADLs Comments: family for IADLs, pt can complete ADLs      Extremity/Trunk Assessment   Upper Extremity Assessment Upper Extremity Assessment: Generalized weakness    Lower Extremity Assessment Lower Extremity Assessment: Generalized weakness (grossly 3/5, limited power)    Cervical / Trunk Assessment Cervical / Trunk Assessment: Kyphotic  Communication   Communication Communication: No apparent difficulties Cueing Techniques: Verbal cues  Cognition Arousal: Alert Behavior During Therapy: WFL for tasks assessed/performed Overall Cognitive Status: Within Functional Limits for tasks assessed                                          General Comments General comments (skin integrity, edema, etc.): VSS on RA, BP stable with changes in position        Assessment/Plan    PT Assessment Patient needs continued PT services  PT Problem List Decreased strength;Decreased range of motion;Decreased activity tolerance;Decreased balance;Decreased mobility;Pain       PT Treatment Interventions DME instruction;Gait training;Stair training;Functional mobility training;Therapeutic activities;Therapeutic exercise;Balance training    PT Goals (Current goals can be found in the Care Plan section)  Acute Rehab PT Goals Patient Stated Goal: return to walking household distances, going to bathroom independently PT Goal Formulation: With patient/family Time For Goal Achievement: 12/19/23 Potential to Achieve Goals: Good    Frequency Min 1X/week        AM-PAC PT "6 Clicks" Mobility  Outcome Measure Help needed turning from your back to your side while in a flat bed without using bedrails?: A Little Help needed moving from lying on your back to sitting on the side of a flat bed without using bedrails?: A Little Help needed moving to and from a bed to a chair (including a wheelchair)?: A Little Help needed standing up from a chair using your arms (e.g., wheelchair or bedside chair)?: A Little Help needed to walk in hospital  room?: A Little Help needed climbing 3-5 steps with a railing? : A Little 6 Click Score: 18    End of Session Equipment Utilized During Treatment: Gait belt Activity Tolerance: Patient tolerated treatment well Patient left: in chair;with call bell/phone within reach;with chair alarm set;with nursing/sitter in room;with family/visitor present Nurse Communication: Mobility status PT Visit Diagnosis: Unsteadiness on feet (R26.81);Other abnormalities of gait and mobility (R26.89);Muscle weakness (generalized) (M62.81);Pain Pain - part of body: Knee    Time: 0839-0920 PT Time Calculation (min) (ACUTE ONLY): 41 min   Charges:   PT Evaluation $PT Eval Moderate Complexity: 1 Mod PT Treatments $Gait Training: 8-22 mins $Therapeutic Exercise: 8-22 mins PT General Charges $$ ACUTE PT VISIT: 1 Visit         Vickki Muff, PT, DPT   Acute Rehabilitation Department Office [phone] Secure Chat Communication Preferred  Ronnie Derby 12/05/2023, 12:36 PM

## 2023-12-05 NOTE — Discharge Summary (Signed)
Physician Discharge Summary  Lindsey Odom EXB:[id number] DOB: Apr 19, 1926 DOA: 12/03/2023  PCP: Georgann Housekeeper, MD  Admit date: 12/03/2023 Discharge date: 12/05/2023  Admitted From: Home Disposition:  Home  Discharge Condition:Stable CODE STATUS:FULL Diet recommendation: Heart Healthy   Brief/Interim Summary: Patient is a 89 year old female with history of hypertension, CKD stage III, hyperlipidemia who presented with syncope, confusion from home. Presented for syncopal workup.    Lab work showed AKI on CKD, started on IV fluid.  Kidney function improved with IV fluid.Syncopal workup completed.  PT evaluation done, recommended home health.  Medically stable for discharge home today.    Following problems were addressed during the hospitalization:  Syncopal episode: Transient episode of disorientation.  Low suspicion for seizure activity.  No focal deficits.  MRI brain negative for any acute findings.  Echo showed EF of 70 to 75%, grade 1 diastolic dysfunction.  PT consulted, recommended home health   AKI on CKD stage IIIa: Kidney function is significantly improved with IV fluid.  ARB, hydrochlorothiazide, spironolactone at home, currently held.    Renal ultrasound did not show any hydronephrosis, showed normal echogenicity.  Currently kidney function at baseline.   Hypertension: Currently blood pressure stable.  Continue metoprolol for now.  Other medications discontinued   Hyperlipidemia: Continue statin   Thrombocytopenia: Chronic.  Stable   Normocytic anemia: Current hemoglobin stable.  Iron panel normal   Hyperkalemia: Spironolactone, ARB on hold.  Hypokalemic today.  Given supplementation   Insomnia: Continue trazodone  Discharge Diagnoses:  Principal Problem:   Syncope    Discharge Instructions  Discharge Instructions     Diet - low sodium heart healthy   Complete by: As directed    Discharge instructions   Complete by: As directed    1)Please take prescribed  medications as instructed 2)Follow up with your PCP in a week.  Do a BMP test during the follow-up to check your kidney function 3)Monitor your blood pressure at home   Increase activity slowly   Complete by: As directed    No wound care   Complete by: As directed       Allergies as of 12/05/2023       Reactions   Amitriptyline    Jittery   Atorvastatin    Lip swelling   Celecoxib    angioedema   Latex Itching   Lisinopril Other (See Comments)   Rash inside mouth, swelling of lips   Aspirin Rash   Penicillins Rash, Other (See Comments)   Has patient had a PCN reaction causing immediate rash, facial/tongue/throat swelling, SOB or lightheadedness with hypotension: Unknown Has patient had a PCN reaction causing severe rash involving mucus membranes or skin necrosis:Yes  Has patient had a PCN reaction that required hospitalization: No Has patient had a PCN reaction occurring within the last 10 years: No If all of the above answers are "NO", then may proceed with Cephalosporin use.        Medication List     STOP taking these medications    erythromycin 250 MG tablet Commonly known as: E-MYCIN   losartan-hydrochlorothiazide 100-25 MG tablet Commonly known as: HYZAAR   spironolactone 25 MG tablet Commonly known as: ALDACTONE       TAKE these medications    acetaminophen 650 MG CR tablet Commonly known as: TYLENOL Take 1,300 mg by mouth every 8 (eight) hours as needed for pain.   Bee Pollen 580 MG Caps Take 1 capsule by mouth daily.   COD LIVER OIL PO Take  1 capsule by mouth daily.   hydrocortisone cream 1 % Apply 1 application topically 2 (two) times daily as needed for itching.   metoprolol succinate 50 MG 24 hr tablet Commonly known as: TOPROL-XL Take 50 mg by mouth daily. Take with or immediately following a meal.   Potassium 99 MG Tabs Take 2 tablets by mouth daily.   PRESERVISION AREDS PO Take 2 capsules by mouth daily.   rosuvastatin 10 MG  tablet Commonly known as: CRESTOR Take 10 mg by mouth daily.   temazepam 15 MG capsule Commonly known as: RESTORIL Take 15 mg by mouth at bedtime as needed.   traZODone 50 MG tablet Commonly known as: DESYREL 1 tablet at bedtime as needed Orally At bedtime for 30 days   VITAMIN B-12 PO Take 1 tablet by mouth daily.   vitamin C 1000 MG tablet Take 1,000 mg by mouth daily.   Vitamin D3 25 MCG (1000 UT) Caps Take 1,000 Units by mouth daily.        Follow-up Information     Georgann Housekeeper, MD. Schedule an appointment as soon as possible for a visit in 1 week(s).   Specialty: Internal Medicine Contact information: [address] [place] [address] [phone]                Allergies  Allergen Reactions   Amitriptyline     Jittery   Atorvastatin     Lip swelling   Celecoxib     angioedema   Latex Itching   Lisinopril Other (See Comments)    Rash inside mouth, swelling of lips   Aspirin Rash   Penicillins Rash and Other (See Comments)    Has patient had a PCN reaction causing immediate rash, facial/tongue/throat swelling, SOB or lightheadedness with hypotension: Unknown Has patient had a PCN reaction causing severe rash involving mucus membranes or skin necrosis:Yes  Has patient had a PCN reaction that required hospitalization: No Has patient had a PCN reaction occurring within the last 10 years: No If all of the above answers are "NO", then may proceed with Cephalosporin use.     Consultations: None   Procedures/Studies: US RENAL Result Date: 12/04/2023 CLINICAL DATA:  Acute kidney insufficiency EXAM: RENAL / URINARY TRACT ULTRASOUND COMPLETE COMPARISON:  CT 01/03/2021. FINDINGS: Right Kidney: Renal measurements: 7.4 x 4.2 x 4.7 cm = volume: 77.0 mL. Echogenicity within normal limits. No mass or hydronephrosis visualized. Left Kidney: Renal measurements: 7.8 x 4.5 x 3.8 cm = volume: 69.2 mL. Echogenicity within normal limits. No mass  or hydronephrosis visualized. Bladder: Appears normal for degree of bladder distention. Other: None. IMPRESSION: No collecting system dilatation. Electronically Signed   By: Karen Kays M.D.   On: 12/04/2023 11:52   ECHOCARDIOGRAM COMPLETE Result Date: 12/04/2023    ECHOCARDIOGRAM REPORT   Patient Name:   Lindsey Odom Date of Exam: 12/04/2023 Medical Rec #:  [id number]        Height:       62.0 in Accession #:    [id number]       Weight:       115.1 lb Date of Birth:  October 07, 1926        BSA:          1.511 m Patient Age:    89 years         BP:           126/74 mmHg Patient Gender: F  HR:           80 bpm. Exam Location:  Inpatient Procedure: 2D Echo, 3D Echo, Cardiac Doppler, Color Doppler and Strain Analysis Indications:    Syncope  History:        Patient has no prior history of Echocardiogram examinations.                 Risk Factors:Hypertension.  Sonographer:    Karma Ganja Referring Phys: [id number] ROBERT DORRELL  Sonographer Comments: Global longitudinal strain was attempted. IMPRESSIONS  1. Left ventricular ejection fraction, by estimation, is 70 to 75%. Left ventricular ejection fraction by 3D volume is 72 %. The left ventricle has hyperdynamic function. The left ventricle has no regional wall motion abnormalities. Left ventricular diastolic parameters are consistent with Grade I diastolic dysfunction (impaired relaxation). The average left ventricular global longitudinal strain is -22.2 %. The global longitudinal strain is normal.  2. Right ventricular systolic function is normal. The right ventricular size is normal. There is mildly elevated pulmonary artery systolic pressure. The estimated right ventricular systolic pressure is 42.3 mmHg.  3. The mitral valve is normal in structure. Mild mitral valve regurgitation. No evidence of mitral stenosis. Moderate mitral annular calcification.  4. Tricuspid valve regurgitation is moderate.  5. The aortic valve is calcified. There is moderate  calcification of the aortic valve. There is moderate thickening of the aortic valve. Aortic valve regurgitation is not visualized. Aortic valve sclerosis/calcification is present, without any evidence of aortic stenosis. Aortic valve mean gradient measures 6.0 mmHg. Aortic valve Vmax measures 1.62 m/s.  6. The inferior vena cava is normal in size with greater than 50% respiratory variability, suggesting right atrial pressure of 3 mmHg. FINDINGS  Left Ventricle: Left ventricular ejection fraction, by estimation, is 70 to 75%. Left ventricular ejection fraction by 3D volume is 72 %. The left ventricle has hyperdynamic function. The left ventricle has no regional wall motion abnormalities. The average left ventricular global longitudinal strain is -22.2 %. The global longitudinal strain is normal. The left ventricular internal cavity size was normal in size. There is no left ventricular hypertrophy. Left ventricular diastolic parameters are consistent with Grade I diastolic dysfunction (impaired relaxation). Right Ventricle: The right ventricular size is normal. No increase in right ventricular wall thickness. Right ventricular systolic function is normal. There is mildly elevated pulmonary artery systolic pressure. The tricuspid regurgitant velocity is 2.93  m/s, and with an assumed right atrial pressure of 8 mmHg, the estimated right ventricular systolic pressure is 42.3 mmHg. Left Atrium: Left atrial size was normal in size. Right Atrium: Right atrial size was normal in size. Pericardium: There is no evidence of pericardial effusion. Mitral Valve: The mitral valve is normal in structure. There is mild thickening of the mitral valve leaflet(s). Moderate mitral annular calcification. Mild mitral valve regurgitation. No evidence of mitral valve stenosis. Tricuspid Valve: The tricuspid valve is normal in structure. Tricuspid valve regurgitation is moderate . No evidence of tricuspid stenosis. Aortic Valve: The aortic  valve is calcified. There is moderate calcification of the aortic valve. There is moderate thickening of the aortic valve. Aortic valve regurgitation is not visualized. Aortic regurgitation PHT measures 605 msec. Aortic valve sclerosis/calcification is present, without any evidence of aortic stenosis. Aortic valve mean gradient measures 6.0 mmHg. Aortic valve peak gradient measures 10.5 mmHg. Aortic valve area, by VTI measures 1.75 cm. Pulmonic Valve: The pulmonic valve was normal in structure. Pulmonic valve regurgitation is not visualized. No evidence of pulmonic stenosis.  Aorta: The aortic root is normal in size and structure. Venous: The inferior vena cava is normal in size with greater than 50% respiratory variability, suggesting right atrial pressure of 3 mmHg. IAS/Shunts: No atrial level shunt detected by color flow Doppler.  LEFT VENTRICLE PLAX 2D LVIDd:         2.70 cm         Diastology LVIDs:         1.60 cm         LV e' medial:    7.62 cm/s LV PW:         1.10 cm         LV E/e' medial:  16.4 LV IVS:        1.10 cm         LV e' lateral:   9.79 cm/s LVOT diam:     1.80 cm         LV E/e' lateral: 12.8 LV SV:         52 LV SV Index:   35              2D LVOT Area:     2.54 cm        Longitudinal                                Strain                                2D Strain GLS  -22.2 %                                Avg:                                 3D Volume EF                                LV 3D EF:    Left                                             ventricul                                             ar                                             ejection                                             fraction  by 3D                                             volume is                                             72 %.                                 3D Volume EF:                                3D EF:        72 %                                LV EDV:        66 ml                                LV ESV:       18 ml                                LV SV:        48 ml RIGHT VENTRICLE             IVC RV Basal diam:  3.80 cm     IVC diam: 2.00 cm RV S prime:     16.30 cm/s TAPSE (M-mode): 2.6 cm LEFT ATRIUM             Index        RIGHT ATRIUM           Index LA diam:        2.60 cm 1.72 cm/m   RA Area:     16.00 cm LA Vol (A2C):   63.1 ml 41.75 ml/m  RA Volume:   43.90 ml  29.04 ml/m LA Vol (A4C):   35.4 ml 23.42 ml/m LA Biplane Vol: 49.8 ml 32.95 ml/m  AORTIC VALVE AV Area (Vmax):    1.59 cm AV Area (Vmean):   1.60 cm AV Area (VTI):     1.75 cm AV Vmax:           162.00 cm/s AV Vmean:          109.000 cm/s AV VTI:            0.299 m AV Peak Grad:      10.5 mmHg AV Mean Grad:      6.0 mmHg LVOT Vmax:         101.00 cm/s LVOT Vmean:        68.600 cm/s LVOT VTI:          0.206 m LVOT/AV VTI ratio: 0.69 AI PHT:            605 msec  AORTA Ao Root diam: 3.10 cm MITRAL VALVE                TRICUSPID  VALVE MV Area (PHT): 3.99 cm     TR Peak grad:   34.3 mmHg MV Decel Time: 190 msec     TR Vmax:        293.00 cm/s MV E velocity: 125.00 cm/s MV A velocity: 155.00 cm/s  SHUNTS MV E/A ratio:  0.81         Systemic VTI:  0.21 m                             Systemic Diam: 1.80 cm Donato Schultz MD Electronically signed by Donato Schultz MD Signature Date/Time: 12/04/2023/10:49:50 AM    Final    MR BRAIN WO CONTRAST Result Date: 12/04/2023 CLINICAL DATA:  Weakness and near syncope EXAM: MRI HEAD WITHOUT CONTRAST TECHNIQUE: Multiplanar, multiecho pulse sequences of the brain and surrounding structures were obtained without intravenous contrast. COMPARISON:  No prior MRI available, correlation is made with 02/25/2018 CT head some FINDINGS: Brain: No restricted diffusion to suggest acute or subacute infarct. No acute hemorrhage, mass, mass effect, or midline shift. No hydrocephalus or extra-axial collection. Pituitary and craniocervical junction within normal limits. Focus of  hemosiderin deposition in the left frontal lobe, likely sequela of prior hypertensive microhemorrhage. Mineralization in the basal ganglia and dentate nuclei. Confluent T2 hyperintense signal in the periventricular white matter and pons, likely the sequela of moderate chronic small vessel ischemic disease. Age related cerebral atrophy. Vascular: Normal arterial flow voids. Skull and upper cervical spine: Normal marrow signal. Sinuses/Orbits: Clear paranasal sinuses. No acute finding in the orbits. Status post bilateral lens replacements. Other: The mastoid air cells are well aerated. IMPRESSION: No acute intracranial process. No evidence of acute or subacute infarct. Electronically Signed   By: Wiliam Ke M.D.   On: 12/04/2023 02:14   DG Chest Port 1 View Result Date: 12/03/2023 CLINICAL DATA:  Syncope EXAM: PORTABLE CHEST 1 VIEW COMPARISON:  03/05/2018 FINDINGS: Heart is borderline in size. Tortuous aorta. No confluent airspace opacities or effusions. No acute bony abnormality. IMPRESSION: No active disease. Electronically Signed   By: Charlett Nose M.D.   On: 12/03/2023 23:25      Subjective: Patient seen and examined at bedside today.  Hemodynamically stable.  Comfortable, today in the chair.  Denies any complaints.  Mentation at baseline.  Discharge planning discussed with daughters at bedside and found  Discharge Exam: Vitals:   12/05/23 0800 12/05/23 1028  BP:  122/62  Pulse:    Resp:    Temp:    SpO2: 100%    Vitals:   12/05/23 0529 12/05/23 0750 12/05/23 0800 12/05/23 1028  BP: 137/80 (!) 152/88  122/62  Pulse: 81 90    Resp:  18    Temp: 98.7 F (37.1 C) 98.4 F (36.9 C)    TempSrc: Oral Oral    SpO2: 99% (!) 73% 100%   Weight:      Height:        General: Pt is alert, awake, not in acute distress Cardiovascular: RRR, S1/S2 +, no rubs, no gallops Respiratory: CTA bilaterally, no wheezing, no rhonchi Abdominal: Soft, NT, ND, bowel sounds + Extremities: no edema, no  cyanosis    The results of significant diagnostics from this hospitalization (including imaging, microbiology, ancillary and laboratory) are listed below for reference.     Microbiology: No results found for this or any previous visit (from the past 240 hours).   Labs: BNP (last 3 results) No results for input(s): "  BNP" in the last 8760 hours. Basic Metabolic Panel: Recent Labs  Lab 12/03/23 2140 12/04/23 0402 12/05/23 0659  NA 139 144 143  K 5.2* 3.5 3.4*  CL 104 110 110  CO2 21* 24 25  GLUCOSE 116* 99 91  BUN 29* 26* 14  CREATININE 1.93* 1.78* 1.27*  CALCIUM 9.9 9.2 9.1  PHOS  --   --  2.7   Liver Function Tests: Recent Labs  Lab 12/05/23 0659  ALBUMIN 2.9*   No results for input(s): "LIPASE", "AMYLASE" in the last 168 hours. No results for input(s): "AMMONIA" in the last 168 hours. CBC: Recent Labs  Lab 12/03/23 2140 12/04/23 0939 12/05/23 0659  WBC 5.6 5.0 6.0  HGB 12.3 9.9* 9.9*  HCT 39.5 31.9* 31.0*  MCV 97.3 96.7 95.1  PLT 65* 90* 93*   Cardiac Enzymes: No results for input(s): "CKTOTAL", "CKMB", "CKMBINDEX", "TROPONINI" in the last 168 hours. BNP: Invalid input(s): "POCBNP" CBG: No results for input(s): "GLUCAP" in the last 168 hours. D-Dimer No results for input(s): "DDIMER" in the last 72 hours. Hgb A1c No results for input(s): "HGBA1C" in the last 72 hours. Lipid Profile No results for input(s): "CHOL", "HDL", "LDLCALC", "TRIG", "CHOLHDL", "LDLDIRECT" in the last 72 hours. Thyroid function studies No results for input(s): "TSH", "T4TOTAL", "T3FREE", "THYROIDAB" in the last 72 hours.  Invalid input(s): "FREET3" Anemia work up Recent Labs    12/05/23 0659  VITAMINB12 3,752*  FOLATE 11.7  FERRITIN 144  TIBC 267  IRON 54  RETICCTPCT 1.6   Urinalysis    Component Value Date/Time   COLORURINE YELLOW 12/04/2023 1632   APPEARANCEUR CLEAR 12/04/2023 1632   LABSPEC 1.004 (L) 12/04/2023 1632   PHURINE 6.0 12/04/2023 1632   GLUCOSEU  NEGATIVE 12/04/2023 1632   HGBUR NEGATIVE 12/04/2023 1632   BILIRUBINUR NEGATIVE 12/04/2023 1632   KETONESUR NEGATIVE 12/04/2023 1632   PROTEINUR NEGATIVE 12/04/2023 1632   NITRITE POSITIVE (A) 12/04/2023 1632   LEUKOCYTESUR MODERATE (A) 12/04/2023 1632   Sepsis Labs Recent Labs  Lab 12/03/23 2140 12/04/23 0939 12/05/23 0659  WBC 5.6 5.0 6.0   Microbiology No results found for this or any previous visit (from the past 240 hours).  Please note: You were cared for by a hospitalist during your hospital stay. Once you are discharged, your primary care physician will handle any further medical issues. Please note that NO REFILLS for any discharge medications will be authorized once you are discharged, as it is imperative that you return to your primary care physician (or establish a relationship with a primary care physician if you do not have one) for your post hospital discharge needs so that they can reassess your need for medications and monitor your lab values.    Time coordinating discharge: 40 minutes  SIGNED:   Burnadette Pop, MD  [facility] 12/05/2023, 10:42 AM Pager [phone]  If 7PM-7AM, please contact night-coverage [email or web address] Password [email or web address]

## 2023-12-18 ENCOUNTER — Encounter (INDEPENDENT_AMBULATORY_CARE_PROVIDER_SITE_OTHER): Payer: Medicare Other | Admitting: Ophthalmology

## 2023-12-18 DIAGNOSIS — H35033 Hypertensive retinopathy, bilateral: Secondary | ICD-10-CM

## 2023-12-18 DIAGNOSIS — H43813 Vitreous degeneration, bilateral: Secondary | ICD-10-CM

## 2023-12-18 DIAGNOSIS — H353112 Nonexudative age-related macular degeneration, right eye, intermediate dry stage: Secondary | ICD-10-CM

## 2023-12-18 DIAGNOSIS — H353221 Exudative age-related macular degeneration, left eye, with active choroidal neovascularization: Secondary | ICD-10-CM

## 2023-12-18 DIAGNOSIS — I10 Essential (primary) hypertension: Secondary | ICD-10-CM | POA: Diagnosis not present

## 2023-12-19 DIAGNOSIS — E785 Hyperlipidemia, unspecified: Secondary | ICD-10-CM | POA: Diagnosis not present

## 2023-12-19 DIAGNOSIS — I700 Atherosclerosis of aorta: Secondary | ICD-10-CM | POA: Diagnosis not present

## 2023-12-19 DIAGNOSIS — D696 Thrombocytopenia, unspecified: Secondary | ICD-10-CM | POA: Diagnosis not present

## 2023-12-19 DIAGNOSIS — I10 Essential (primary) hypertension: Secondary | ICD-10-CM | POA: Diagnosis not present

## 2023-12-19 DIAGNOSIS — Z0000 Encounter for general adult medical examination without abnormal findings: Secondary | ICD-10-CM | POA: Diagnosis not present

## 2023-12-19 DIAGNOSIS — N1831 Chronic kidney disease, stage 3a: Secondary | ICD-10-CM | POA: Diagnosis not present

## 2023-12-19 DIAGNOSIS — R600 Localized edema: Secondary | ICD-10-CM | POA: Diagnosis not present

## 2023-12-19 DIAGNOSIS — K5900 Constipation, unspecified: Secondary | ICD-10-CM | POA: Diagnosis not present

## 2023-12-19 DIAGNOSIS — K219 Gastro-esophageal reflux disease without esophagitis: Secondary | ICD-10-CM | POA: Diagnosis not present

## 2023-12-19 DIAGNOSIS — R269 Unspecified abnormalities of gait and mobility: Secondary | ICD-10-CM | POA: Diagnosis not present

## 2023-12-19 DIAGNOSIS — H3530 Unspecified macular degeneration: Secondary | ICD-10-CM | POA: Diagnosis not present

## 2024-01-14 ENCOUNTER — Telehealth (INDEPENDENT_AMBULATORY_CARE_PROVIDER_SITE_OTHER): Payer: Self-pay | Admitting: Otolaryngology

## 2024-01-14 NOTE — Telephone Encounter (Signed)
 LVM to confirm appt & location [phone] afm

## 2024-01-15 ENCOUNTER — Ambulatory Visit (INDEPENDENT_AMBULATORY_CARE_PROVIDER_SITE_OTHER): Payer: Medicare Other | Admitting: Otolaryngology

## 2024-01-15 ENCOUNTER — Encounter (INDEPENDENT_AMBULATORY_CARE_PROVIDER_SITE_OTHER): Payer: Self-pay

## 2024-01-15 VITALS — HR 70 | Ht 62.0 in | Wt 130.0 lb

## 2024-01-15 DIAGNOSIS — H6123 Impacted cerumen, bilateral: Secondary | ICD-10-CM

## 2024-01-18 DIAGNOSIS — H6123 Impacted cerumen, bilateral: Secondary | ICD-10-CM | POA: Insufficient documentation

## 2024-01-18 NOTE — Progress Notes (Signed)
 Patient ID: Lindsey Odom, female   DOB: 1926-09-28, 89 y.o.   MRN: [id number]  Procedure: Bilateral cerumen disimpaction.   Indication: Cerumen impaction, resulting in ear discomfort and conductive hearing loss.   Description: The patient is placed supine on the operating table. Under the operating microscope, the right ear canal is examined and is noted to be impacted with cerumen. The cerumen is carefully removed with a combination of suction catheters, cerumen curette, and alligator forceps. After the cerumen removal, the ear canal and tympanic membrane are noted to be normal. No middle ear effusion is noted. The same procedure is then repeated on the left side without exception. The patient tolerated the procedure well.  Follow-up care:  The patient is instructed not to use Q-tips to clean the ear canals. The patient will follow up in 6 months.

## 2024-01-23 DIAGNOSIS — M1712 Unilateral primary osteoarthritis, left knee: Secondary | ICD-10-CM | POA: Diagnosis not present

## 2024-02-05 ENCOUNTER — Encounter (INDEPENDENT_AMBULATORY_CARE_PROVIDER_SITE_OTHER): Payer: Medicare Other | Admitting: Ophthalmology

## 2024-02-05 DIAGNOSIS — H353112 Nonexudative age-related macular degeneration, right eye, intermediate dry stage: Secondary | ICD-10-CM

## 2024-02-05 DIAGNOSIS — H353221 Exudative age-related macular degeneration, left eye, with active choroidal neovascularization: Secondary | ICD-10-CM | POA: Diagnosis not present

## 2024-02-05 DIAGNOSIS — H43813 Vitreous degeneration, bilateral: Secondary | ICD-10-CM

## 2024-02-05 DIAGNOSIS — I10 Essential (primary) hypertension: Secondary | ICD-10-CM

## 2024-02-05 DIAGNOSIS — H35033 Hypertensive retinopathy, bilateral: Secondary | ICD-10-CM | POA: Diagnosis not present

## 2024-03-03 DIAGNOSIS — M25552 Pain in left hip: Secondary | ICD-10-CM | POA: Diagnosis not present

## 2024-03-03 DIAGNOSIS — M1712 Unilateral primary osteoarthritis, left knee: Secondary | ICD-10-CM | POA: Diagnosis not present

## 2024-03-04 DIAGNOSIS — I10 Essential (primary) hypertension: Secondary | ICD-10-CM | POA: Diagnosis not present

## 2024-03-04 DIAGNOSIS — N1831 Chronic kidney disease, stage 3a: Secondary | ICD-10-CM | POA: Diagnosis not present

## 2024-03-04 DIAGNOSIS — M7989 Other specified soft tissue disorders: Secondary | ICD-10-CM | POA: Diagnosis not present

## 2024-03-18 DIAGNOSIS — R600 Localized edema: Secondary | ICD-10-CM | POA: Diagnosis not present

## 2024-03-18 DIAGNOSIS — I5189 Other ill-defined heart diseases: Secondary | ICD-10-CM | POA: Diagnosis not present

## 2024-03-18 DIAGNOSIS — I10 Essential (primary) hypertension: Secondary | ICD-10-CM | POA: Diagnosis not present

## 2024-03-18 DIAGNOSIS — R410 Disorientation, unspecified: Secondary | ICD-10-CM | POA: Diagnosis not present

## 2024-04-01 ENCOUNTER — Encounter (INDEPENDENT_AMBULATORY_CARE_PROVIDER_SITE_OTHER): Payer: Medicare Other | Admitting: Ophthalmology

## 2024-04-01 DIAGNOSIS — H35033 Hypertensive retinopathy, bilateral: Secondary | ICD-10-CM

## 2024-04-01 DIAGNOSIS — H353221 Exudative age-related macular degeneration, left eye, with active choroidal neovascularization: Secondary | ICD-10-CM

## 2024-04-01 DIAGNOSIS — I10 Essential (primary) hypertension: Secondary | ICD-10-CM

## 2024-04-01 DIAGNOSIS — H43813 Vitreous degeneration, bilateral: Secondary | ICD-10-CM

## 2024-04-01 DIAGNOSIS — H353112 Nonexudative age-related macular degeneration, right eye, intermediate dry stage: Secondary | ICD-10-CM

## 2024-04-16 DIAGNOSIS — N189 Chronic kidney disease, unspecified: Secondary | ICD-10-CM | POA: Diagnosis not present

## 2024-04-16 DIAGNOSIS — I169 Hypertensive crisis, unspecified: Secondary | ICD-10-CM | POA: Diagnosis not present

## 2024-04-16 DIAGNOSIS — I4729 Other ventricular tachycardia: Secondary | ICD-10-CM | POA: Diagnosis not present

## 2024-04-16 DIAGNOSIS — M1712 Unilateral primary osteoarthritis, left knee: Secondary | ICD-10-CM | POA: Diagnosis not present

## 2024-04-16 DIAGNOSIS — R531 Weakness: Secondary | ICD-10-CM | POA: Diagnosis not present

## 2024-04-16 DIAGNOSIS — N179 Acute kidney failure, unspecified: Secondary | ICD-10-CM | POA: Diagnosis not present

## 2024-04-16 DIAGNOSIS — M25561 Pain in right knee: Secondary | ICD-10-CM | POA: Diagnosis not present

## 2024-04-16 DIAGNOSIS — Z880 Allergy status to penicillin: Secondary | ICD-10-CM | POA: Diagnosis not present

## 2024-04-16 DIAGNOSIS — R600 Localized edema: Secondary | ICD-10-CM | POA: Diagnosis not present

## 2024-04-16 DIAGNOSIS — Z79899 Other long term (current) drug therapy: Secondary | ICD-10-CM | POA: Diagnosis not present

## 2024-04-16 DIAGNOSIS — R8290 Unspecified abnormal findings in urine: Secondary | ICD-10-CM | POA: Diagnosis not present

## 2024-04-16 DIAGNOSIS — E785 Hyperlipidemia, unspecified: Secondary | ICD-10-CM | POA: Diagnosis not present

## 2024-04-16 DIAGNOSIS — I10 Essential (primary) hypertension: Secondary | ICD-10-CM | POA: Diagnosis not present

## 2024-04-16 DIAGNOSIS — I129 Hypertensive chronic kidney disease with stage 1 through stage 4 chronic kidney disease, or unspecified chronic kidney disease: Secondary | ICD-10-CM | POA: Diagnosis not present

## 2024-04-16 DIAGNOSIS — I160 Hypertensive urgency: Secondary | ICD-10-CM | POA: Diagnosis not present

## 2024-04-16 DIAGNOSIS — I4719 Other supraventricular tachycardia: Secondary | ICD-10-CM | POA: Diagnosis not present

## 2024-04-16 DIAGNOSIS — E876 Hypokalemia: Secondary | ICD-10-CM | POA: Diagnosis not present

## 2024-04-16 DIAGNOSIS — I472 Ventricular tachycardia, unspecified: Secondary | ICD-10-CM | POA: Diagnosis not present

## 2024-04-16 DIAGNOSIS — I6782 Cerebral ischemia: Secondary | ICD-10-CM | POA: Diagnosis not present

## 2024-04-16 DIAGNOSIS — R2689 Other abnormalities of gait and mobility: Secondary | ICD-10-CM | POA: Diagnosis not present

## 2024-04-16 DIAGNOSIS — M25562 Pain in left knee: Secondary | ICD-10-CM | POA: Diagnosis not present

## 2024-04-16 DIAGNOSIS — Z886 Allergy status to analgesic agent status: Secondary | ICD-10-CM | POA: Diagnosis not present

## 2024-04-17 DIAGNOSIS — I169 Hypertensive crisis, unspecified: Secondary | ICD-10-CM | POA: Diagnosis not present

## 2024-04-17 DIAGNOSIS — Z79899 Other long term (current) drug therapy: Secondary | ICD-10-CM | POA: Diagnosis not present

## 2024-04-17 DIAGNOSIS — I4719 Other supraventricular tachycardia: Secondary | ICD-10-CM | POA: Diagnosis not present

## 2024-04-17 DIAGNOSIS — R600 Localized edema: Secondary | ICD-10-CM | POA: Diagnosis not present

## 2024-04-17 DIAGNOSIS — E876 Hypokalemia: Secondary | ICD-10-CM | POA: Diagnosis not present

## 2024-04-17 DIAGNOSIS — E785 Hyperlipidemia, unspecified: Secondary | ICD-10-CM | POA: Diagnosis not present

## 2024-04-17 DIAGNOSIS — I160 Hypertensive urgency: Secondary | ICD-10-CM | POA: Diagnosis not present

## 2024-04-17 DIAGNOSIS — I129 Hypertensive chronic kidney disease with stage 1 through stage 4 chronic kidney disease, or unspecified chronic kidney disease: Secondary | ICD-10-CM | POA: Diagnosis not present

## 2024-04-17 DIAGNOSIS — R531 Weakness: Secondary | ICD-10-CM | POA: Diagnosis not present

## 2024-04-17 DIAGNOSIS — M1712 Unilateral primary osteoarthritis, left knee: Secondary | ICD-10-CM | POA: Diagnosis not present

## 2024-04-17 DIAGNOSIS — N179 Acute kidney failure, unspecified: Secondary | ICD-10-CM | POA: Diagnosis not present

## 2024-04-17 DIAGNOSIS — Z886 Allergy status to analgesic agent status: Secondary | ICD-10-CM | POA: Diagnosis not present

## 2024-04-17 DIAGNOSIS — R8290 Unspecified abnormal findings in urine: Secondary | ICD-10-CM | POA: Diagnosis not present

## 2024-04-17 DIAGNOSIS — R2689 Other abnormalities of gait and mobility: Secondary | ICD-10-CM | POA: Diagnosis not present

## 2024-04-17 DIAGNOSIS — Z880 Allergy status to penicillin: Secondary | ICD-10-CM | POA: Diagnosis not present

## 2024-04-17 DIAGNOSIS — R000 Tachycardia, unspecified: Secondary | ICD-10-CM | POA: Diagnosis not present

## 2024-04-17 DIAGNOSIS — N189 Chronic kidney disease, unspecified: Secondary | ICD-10-CM | POA: Diagnosis not present

## 2024-04-17 DIAGNOSIS — I083 Combined rheumatic disorders of mitral, aortic and tricuspid valves: Secondary | ICD-10-CM | POA: Diagnosis not present

## 2024-04-18 DIAGNOSIS — E876 Hypokalemia: Secondary | ICD-10-CM | POA: Diagnosis not present

## 2024-04-18 DIAGNOSIS — E785 Hyperlipidemia, unspecified: Secondary | ICD-10-CM | POA: Diagnosis not present

## 2024-04-18 DIAGNOSIS — N179 Acute kidney failure, unspecified: Secondary | ICD-10-CM | POA: Diagnosis not present

## 2024-04-18 DIAGNOSIS — I4719 Other supraventricular tachycardia: Secondary | ICD-10-CM | POA: Diagnosis not present

## 2024-04-18 DIAGNOSIS — R531 Weakness: Secondary | ICD-10-CM | POA: Diagnosis not present

## 2024-04-18 DIAGNOSIS — R600 Localized edema: Secondary | ICD-10-CM | POA: Diagnosis not present

## 2024-04-18 DIAGNOSIS — Z880 Allergy status to penicillin: Secondary | ICD-10-CM | POA: Diagnosis not present

## 2024-04-18 DIAGNOSIS — I169 Hypertensive crisis, unspecified: Secondary | ICD-10-CM | POA: Diagnosis not present

## 2024-04-18 DIAGNOSIS — I160 Hypertensive urgency: Secondary | ICD-10-CM | POA: Diagnosis not present

## 2024-04-18 DIAGNOSIS — I129 Hypertensive chronic kidney disease with stage 1 through stage 4 chronic kidney disease, or unspecified chronic kidney disease: Secondary | ICD-10-CM | POA: Diagnosis not present

## 2024-04-18 DIAGNOSIS — R8290 Unspecified abnormal findings in urine: Secondary | ICD-10-CM | POA: Diagnosis not present

## 2024-04-18 DIAGNOSIS — M1712 Unilateral primary osteoarthritis, left knee: Secondary | ICD-10-CM | POA: Diagnosis not present

## 2024-04-18 DIAGNOSIS — N189 Chronic kidney disease, unspecified: Secondary | ICD-10-CM | POA: Diagnosis not present

## 2024-04-18 DIAGNOSIS — Z886 Allergy status to analgesic agent status: Secondary | ICD-10-CM | POA: Diagnosis not present

## 2024-04-18 DIAGNOSIS — R2689 Other abnormalities of gait and mobility: Secondary | ICD-10-CM | POA: Diagnosis not present

## 2024-04-18 DIAGNOSIS — Z79899 Other long term (current) drug therapy: Secondary | ICD-10-CM | POA: Diagnosis not present

## 2024-04-19 DIAGNOSIS — Z886 Allergy status to analgesic agent status: Secondary | ICD-10-CM | POA: Diagnosis not present

## 2024-04-19 DIAGNOSIS — E785 Hyperlipidemia, unspecified: Secondary | ICD-10-CM | POA: Diagnosis not present

## 2024-04-19 DIAGNOSIS — I129 Hypertensive chronic kidney disease with stage 1 through stage 4 chronic kidney disease, or unspecified chronic kidney disease: Secondary | ICD-10-CM | POA: Diagnosis not present

## 2024-04-19 DIAGNOSIS — R600 Localized edema: Secondary | ICD-10-CM | POA: Diagnosis not present

## 2024-04-19 DIAGNOSIS — N189 Chronic kidney disease, unspecified: Secondary | ICD-10-CM | POA: Diagnosis not present

## 2024-04-19 DIAGNOSIS — I4719 Other supraventricular tachycardia: Secondary | ICD-10-CM | POA: Diagnosis not present

## 2024-04-19 DIAGNOSIS — E876 Hypokalemia: Secondary | ICD-10-CM | POA: Diagnosis not present

## 2024-04-19 DIAGNOSIS — I160 Hypertensive urgency: Secondary | ICD-10-CM | POA: Diagnosis not present

## 2024-04-19 DIAGNOSIS — R531 Weakness: Secondary | ICD-10-CM | POA: Diagnosis not present

## 2024-04-19 DIAGNOSIS — M1712 Unilateral primary osteoarthritis, left knee: Secondary | ICD-10-CM | POA: Diagnosis not present

## 2024-04-19 DIAGNOSIS — R2689 Other abnormalities of gait and mobility: Secondary | ICD-10-CM | POA: Diagnosis not present

## 2024-04-19 DIAGNOSIS — N179 Acute kidney failure, unspecified: Secondary | ICD-10-CM | POA: Diagnosis not present

## 2024-04-19 DIAGNOSIS — Z880 Allergy status to penicillin: Secondary | ICD-10-CM | POA: Diagnosis not present

## 2024-04-19 DIAGNOSIS — I169 Hypertensive crisis, unspecified: Secondary | ICD-10-CM | POA: Diagnosis not present

## 2024-04-19 DIAGNOSIS — R8290 Unspecified abnormal findings in urine: Secondary | ICD-10-CM | POA: Diagnosis not present

## 2024-04-19 DIAGNOSIS — Z79899 Other long term (current) drug therapy: Secondary | ICD-10-CM | POA: Diagnosis not present

## 2024-04-20 DIAGNOSIS — E785 Hyperlipidemia, unspecified: Secondary | ICD-10-CM | POA: Diagnosis not present

## 2024-04-20 DIAGNOSIS — I4719 Other supraventricular tachycardia: Secondary | ICD-10-CM | POA: Diagnosis not present

## 2024-04-20 DIAGNOSIS — I129 Hypertensive chronic kidney disease with stage 1 through stage 4 chronic kidney disease, or unspecified chronic kidney disease: Secondary | ICD-10-CM | POA: Diagnosis not present

## 2024-04-20 DIAGNOSIS — E876 Hypokalemia: Secondary | ICD-10-CM | POA: Diagnosis not present

## 2024-04-20 DIAGNOSIS — N189 Chronic kidney disease, unspecified: Secondary | ICD-10-CM | POA: Diagnosis not present

## 2024-04-20 DIAGNOSIS — R531 Weakness: Secondary | ICD-10-CM | POA: Diagnosis not present

## 2024-04-20 DIAGNOSIS — M1712 Unilateral primary osteoarthritis, left knee: Secondary | ICD-10-CM | POA: Diagnosis not present

## 2024-04-20 DIAGNOSIS — Z880 Allergy status to penicillin: Secondary | ICD-10-CM | POA: Diagnosis not present

## 2024-04-20 DIAGNOSIS — Z886 Allergy status to analgesic agent status: Secondary | ICD-10-CM | POA: Diagnosis not present

## 2024-04-20 DIAGNOSIS — I160 Hypertensive urgency: Secondary | ICD-10-CM | POA: Diagnosis not present

## 2024-04-20 DIAGNOSIS — Z79899 Other long term (current) drug therapy: Secondary | ICD-10-CM | POA: Diagnosis not present

## 2024-04-20 DIAGNOSIS — I169 Hypertensive crisis, unspecified: Secondary | ICD-10-CM | POA: Diagnosis not present

## 2024-04-20 DIAGNOSIS — R8290 Unspecified abnormal findings in urine: Secondary | ICD-10-CM | POA: Diagnosis not present

## 2024-04-20 DIAGNOSIS — N179 Acute kidney failure, unspecified: Secondary | ICD-10-CM | POA: Diagnosis not present

## 2024-04-20 DIAGNOSIS — R600 Localized edema: Secondary | ICD-10-CM | POA: Diagnosis not present

## 2024-04-20 DIAGNOSIS — R2689 Other abnormalities of gait and mobility: Secondary | ICD-10-CM | POA: Diagnosis not present

## 2024-04-21 DIAGNOSIS — R2689 Other abnormalities of gait and mobility: Secondary | ICD-10-CM | POA: Diagnosis not present

## 2024-04-21 DIAGNOSIS — I129 Hypertensive chronic kidney disease with stage 1 through stage 4 chronic kidney disease, or unspecified chronic kidney disease: Secondary | ICD-10-CM | POA: Diagnosis not present

## 2024-04-21 DIAGNOSIS — Z79899 Other long term (current) drug therapy: Secondary | ICD-10-CM | POA: Diagnosis not present

## 2024-04-21 DIAGNOSIS — I4719 Other supraventricular tachycardia: Secondary | ICD-10-CM | POA: Diagnosis not present

## 2024-04-21 DIAGNOSIS — N189 Chronic kidney disease, unspecified: Secondary | ICD-10-CM | POA: Diagnosis not present

## 2024-04-21 DIAGNOSIS — I169 Hypertensive crisis, unspecified: Secondary | ICD-10-CM | POA: Diagnosis not present

## 2024-04-21 DIAGNOSIS — Z886 Allergy status to analgesic agent status: Secondary | ICD-10-CM | POA: Diagnosis not present

## 2024-04-21 DIAGNOSIS — R600 Localized edema: Secondary | ICD-10-CM | POA: Diagnosis not present

## 2024-04-21 DIAGNOSIS — E876 Hypokalemia: Secondary | ICD-10-CM | POA: Diagnosis not present

## 2024-04-21 DIAGNOSIS — M1712 Unilateral primary osteoarthritis, left knee: Secondary | ICD-10-CM | POA: Diagnosis not present

## 2024-04-21 DIAGNOSIS — R8290 Unspecified abnormal findings in urine: Secondary | ICD-10-CM | POA: Diagnosis not present

## 2024-04-21 DIAGNOSIS — R531 Weakness: Secondary | ICD-10-CM | POA: Diagnosis not present

## 2024-04-21 DIAGNOSIS — I160 Hypertensive urgency: Secondary | ICD-10-CM | POA: Diagnosis not present

## 2024-04-21 DIAGNOSIS — E785 Hyperlipidemia, unspecified: Secondary | ICD-10-CM | POA: Diagnosis not present

## 2024-04-21 DIAGNOSIS — N179 Acute kidney failure, unspecified: Secondary | ICD-10-CM | POA: Diagnosis not present

## 2024-04-21 DIAGNOSIS — Z880 Allergy status to penicillin: Secondary | ICD-10-CM | POA: Diagnosis not present

## 2024-04-22 DIAGNOSIS — R531 Weakness: Secondary | ICD-10-CM | POA: Diagnosis not present

## 2024-04-22 DIAGNOSIS — Z79899 Other long term (current) drug therapy: Secondary | ICD-10-CM | POA: Diagnosis not present

## 2024-04-22 DIAGNOSIS — I4719 Other supraventricular tachycardia: Secondary | ICD-10-CM | POA: Diagnosis not present

## 2024-04-22 DIAGNOSIS — Z886 Allergy status to analgesic agent status: Secondary | ICD-10-CM | POA: Diagnosis not present

## 2024-04-22 DIAGNOSIS — R600 Localized edema: Secondary | ICD-10-CM | POA: Diagnosis not present

## 2024-04-22 DIAGNOSIS — Z880 Allergy status to penicillin: Secondary | ICD-10-CM | POA: Diagnosis not present

## 2024-04-22 DIAGNOSIS — E876 Hypokalemia: Secondary | ICD-10-CM | POA: Diagnosis not present

## 2024-04-22 DIAGNOSIS — I160 Hypertensive urgency: Secondary | ICD-10-CM | POA: Diagnosis not present

## 2024-04-22 DIAGNOSIS — M1712 Unilateral primary osteoarthritis, left knee: Secondary | ICD-10-CM | POA: Diagnosis not present

## 2024-04-22 DIAGNOSIS — R2689 Other abnormalities of gait and mobility: Secondary | ICD-10-CM | POA: Diagnosis not present

## 2024-04-22 DIAGNOSIS — N179 Acute kidney failure, unspecified: Secondary | ICD-10-CM | POA: Diagnosis not present

## 2024-04-22 DIAGNOSIS — E785 Hyperlipidemia, unspecified: Secondary | ICD-10-CM | POA: Diagnosis not present

## 2024-04-22 DIAGNOSIS — N189 Chronic kidney disease, unspecified: Secondary | ICD-10-CM | POA: Diagnosis not present

## 2024-04-22 DIAGNOSIS — R8290 Unspecified abnormal findings in urine: Secondary | ICD-10-CM | POA: Diagnosis not present

## 2024-04-22 DIAGNOSIS — I169 Hypertensive crisis, unspecified: Secondary | ICD-10-CM | POA: Diagnosis not present

## 2024-04-22 DIAGNOSIS — I129 Hypertensive chronic kidney disease with stage 1 through stage 4 chronic kidney disease, or unspecified chronic kidney disease: Secondary | ICD-10-CM | POA: Diagnosis not present

## 2024-04-23 DIAGNOSIS — Z886 Allergy status to analgesic agent status: Secondary | ICD-10-CM | POA: Diagnosis not present

## 2024-04-23 DIAGNOSIS — I169 Hypertensive crisis, unspecified: Secondary | ICD-10-CM | POA: Diagnosis not present

## 2024-04-23 DIAGNOSIS — I4719 Other supraventricular tachycardia: Secondary | ICD-10-CM | POA: Diagnosis not present

## 2024-04-23 DIAGNOSIS — I129 Hypertensive chronic kidney disease with stage 1 through stage 4 chronic kidney disease, or unspecified chronic kidney disease: Secondary | ICD-10-CM | POA: Diagnosis not present

## 2024-04-23 DIAGNOSIS — R531 Weakness: Secondary | ICD-10-CM | POA: Diagnosis not present

## 2024-04-23 DIAGNOSIS — N189 Chronic kidney disease, unspecified: Secondary | ICD-10-CM | POA: Diagnosis not present

## 2024-04-23 DIAGNOSIS — Z79899 Other long term (current) drug therapy: Secondary | ICD-10-CM | POA: Diagnosis not present

## 2024-04-23 DIAGNOSIS — I160 Hypertensive urgency: Secondary | ICD-10-CM | POA: Diagnosis not present

## 2024-04-23 DIAGNOSIS — R8290 Unspecified abnormal findings in urine: Secondary | ICD-10-CM | POA: Diagnosis not present

## 2024-04-23 DIAGNOSIS — E876 Hypokalemia: Secondary | ICD-10-CM | POA: Diagnosis not present

## 2024-04-23 DIAGNOSIS — E785 Hyperlipidemia, unspecified: Secondary | ICD-10-CM | POA: Diagnosis not present

## 2024-04-23 DIAGNOSIS — Z880 Allergy status to penicillin: Secondary | ICD-10-CM | POA: Diagnosis not present

## 2024-04-23 DIAGNOSIS — R2689 Other abnormalities of gait and mobility: Secondary | ICD-10-CM | POA: Diagnosis not present

## 2024-04-23 DIAGNOSIS — R600 Localized edema: Secondary | ICD-10-CM | POA: Diagnosis not present

## 2024-04-23 DIAGNOSIS — N179 Acute kidney failure, unspecified: Secondary | ICD-10-CM | POA: Diagnosis not present

## 2024-04-23 DIAGNOSIS — M1712 Unilateral primary osteoarthritis, left knee: Secondary | ICD-10-CM | POA: Diagnosis not present

## 2024-04-24 DIAGNOSIS — Z880 Allergy status to penicillin: Secondary | ICD-10-CM | POA: Diagnosis not present

## 2024-04-24 DIAGNOSIS — E785 Hyperlipidemia, unspecified: Secondary | ICD-10-CM | POA: Diagnosis not present

## 2024-04-24 DIAGNOSIS — I129 Hypertensive chronic kidney disease with stage 1 through stage 4 chronic kidney disease, or unspecified chronic kidney disease: Secondary | ICD-10-CM | POA: Diagnosis not present

## 2024-04-24 DIAGNOSIS — M1712 Unilateral primary osteoarthritis, left knee: Secondary | ICD-10-CM | POA: Diagnosis not present

## 2024-04-24 DIAGNOSIS — R531 Weakness: Secondary | ICD-10-CM | POA: Diagnosis not present

## 2024-04-24 DIAGNOSIS — I169 Hypertensive crisis, unspecified: Secondary | ICD-10-CM | POA: Diagnosis not present

## 2024-04-24 DIAGNOSIS — Z79899 Other long term (current) drug therapy: Secondary | ICD-10-CM | POA: Diagnosis not present

## 2024-04-24 DIAGNOSIS — E876 Hypokalemia: Secondary | ICD-10-CM | POA: Diagnosis not present

## 2024-04-24 DIAGNOSIS — N179 Acute kidney failure, unspecified: Secondary | ICD-10-CM | POA: Diagnosis not present

## 2024-04-24 DIAGNOSIS — I4719 Other supraventricular tachycardia: Secondary | ICD-10-CM | POA: Diagnosis not present

## 2024-04-24 DIAGNOSIS — R2689 Other abnormalities of gait and mobility: Secondary | ICD-10-CM | POA: Diagnosis not present

## 2024-04-24 DIAGNOSIS — R8290 Unspecified abnormal findings in urine: Secondary | ICD-10-CM | POA: Diagnosis not present

## 2024-04-24 DIAGNOSIS — I160 Hypertensive urgency: Secondary | ICD-10-CM | POA: Diagnosis not present

## 2024-04-24 DIAGNOSIS — Z886 Allergy status to analgesic agent status: Secondary | ICD-10-CM | POA: Diagnosis not present

## 2024-04-24 DIAGNOSIS — N189 Chronic kidney disease, unspecified: Secondary | ICD-10-CM | POA: Diagnosis not present

## 2024-04-24 DIAGNOSIS — R600 Localized edema: Secondary | ICD-10-CM | POA: Diagnosis not present

## 2024-04-25 DIAGNOSIS — N179 Acute kidney failure, unspecified: Secondary | ICD-10-CM | POA: Diagnosis not present

## 2024-04-25 DIAGNOSIS — M1712 Unilateral primary osteoarthritis, left knee: Secondary | ICD-10-CM | POA: Diagnosis not present

## 2024-04-25 DIAGNOSIS — I169 Hypertensive crisis, unspecified: Secondary | ICD-10-CM | POA: Diagnosis not present

## 2024-04-25 DIAGNOSIS — E876 Hypokalemia: Secondary | ICD-10-CM | POA: Diagnosis not present

## 2024-04-25 DIAGNOSIS — Z79899 Other long term (current) drug therapy: Secondary | ICD-10-CM | POA: Diagnosis not present

## 2024-04-25 DIAGNOSIS — R8290 Unspecified abnormal findings in urine: Secondary | ICD-10-CM | POA: Diagnosis not present

## 2024-04-25 DIAGNOSIS — I129 Hypertensive chronic kidney disease with stage 1 through stage 4 chronic kidney disease, or unspecified chronic kidney disease: Secondary | ICD-10-CM | POA: Diagnosis not present

## 2024-04-25 DIAGNOSIS — R2689 Other abnormalities of gait and mobility: Secondary | ICD-10-CM | POA: Diagnosis not present

## 2024-04-25 DIAGNOSIS — R600 Localized edema: Secondary | ICD-10-CM | POA: Diagnosis not present

## 2024-04-25 DIAGNOSIS — N189 Chronic kidney disease, unspecified: Secondary | ICD-10-CM | POA: Diagnosis not present

## 2024-04-25 DIAGNOSIS — R531 Weakness: Secondary | ICD-10-CM | POA: Diagnosis not present

## 2024-04-25 DIAGNOSIS — I160 Hypertensive urgency: Secondary | ICD-10-CM | POA: Diagnosis not present

## 2024-04-25 DIAGNOSIS — Z880 Allergy status to penicillin: Secondary | ICD-10-CM | POA: Diagnosis not present

## 2024-04-25 DIAGNOSIS — Z886 Allergy status to analgesic agent status: Secondary | ICD-10-CM | POA: Diagnosis not present

## 2024-04-25 DIAGNOSIS — E785 Hyperlipidemia, unspecified: Secondary | ICD-10-CM | POA: Diagnosis not present

## 2024-04-25 DIAGNOSIS — I4719 Other supraventricular tachycardia: Secondary | ICD-10-CM | POA: Diagnosis not present

## 2024-04-26 DIAGNOSIS — I129 Hypertensive chronic kidney disease with stage 1 through stage 4 chronic kidney disease, or unspecified chronic kidney disease: Secondary | ICD-10-CM | POA: Diagnosis not present

## 2024-04-26 DIAGNOSIS — I160 Hypertensive urgency: Secondary | ICD-10-CM | POA: Diagnosis not present

## 2024-04-26 DIAGNOSIS — Z886 Allergy status to analgesic agent status: Secondary | ICD-10-CM | POA: Diagnosis not present

## 2024-04-26 DIAGNOSIS — R531 Weakness: Secondary | ICD-10-CM | POA: Diagnosis not present

## 2024-04-26 DIAGNOSIS — E876 Hypokalemia: Secondary | ICD-10-CM | POA: Diagnosis not present

## 2024-04-26 DIAGNOSIS — E785 Hyperlipidemia, unspecified: Secondary | ICD-10-CM | POA: Diagnosis not present

## 2024-04-26 DIAGNOSIS — R2689 Other abnormalities of gait and mobility: Secondary | ICD-10-CM | POA: Diagnosis not present

## 2024-04-26 DIAGNOSIS — Z79899 Other long term (current) drug therapy: Secondary | ICD-10-CM | POA: Diagnosis not present

## 2024-04-26 DIAGNOSIS — Z880 Allergy status to penicillin: Secondary | ICD-10-CM | POA: Diagnosis not present

## 2024-04-26 DIAGNOSIS — N189 Chronic kidney disease, unspecified: Secondary | ICD-10-CM | POA: Diagnosis not present

## 2024-04-26 DIAGNOSIS — R600 Localized edema: Secondary | ICD-10-CM | POA: Diagnosis not present

## 2024-04-26 DIAGNOSIS — M1712 Unilateral primary osteoarthritis, left knee: Secondary | ICD-10-CM | POA: Diagnosis not present

## 2024-04-26 DIAGNOSIS — I169 Hypertensive crisis, unspecified: Secondary | ICD-10-CM | POA: Diagnosis not present

## 2024-04-26 DIAGNOSIS — R8290 Unspecified abnormal findings in urine: Secondary | ICD-10-CM | POA: Diagnosis not present

## 2024-04-26 DIAGNOSIS — N179 Acute kidney failure, unspecified: Secondary | ICD-10-CM | POA: Diagnosis not present

## 2024-04-26 DIAGNOSIS — I4719 Other supraventricular tachycardia: Secondary | ICD-10-CM | POA: Diagnosis not present

## 2024-04-30 DIAGNOSIS — E8770 Fluid overload, unspecified: Secondary | ICD-10-CM | POA: Diagnosis not present

## 2024-04-30 DIAGNOSIS — Z556 Problems related to health literacy: Secondary | ICD-10-CM | POA: Diagnosis not present

## 2024-04-30 DIAGNOSIS — I169 Hypertensive crisis, unspecified: Secondary | ICD-10-CM | POA: Diagnosis not present

## 2024-04-30 DIAGNOSIS — H3530 Unspecified macular degeneration: Secondary | ICD-10-CM | POA: Diagnosis not present

## 2024-04-30 DIAGNOSIS — E785 Hyperlipidemia, unspecified: Secondary | ICD-10-CM | POA: Diagnosis not present

## 2024-04-30 DIAGNOSIS — E876 Hypokalemia: Secondary | ICD-10-CM | POA: Diagnosis not present

## 2024-04-30 DIAGNOSIS — Z96651 Presence of right artificial knee joint: Secondary | ICD-10-CM | POA: Diagnosis not present

## 2024-04-30 DIAGNOSIS — Z96643 Presence of artificial hip joint, bilateral: Secondary | ICD-10-CM | POA: Diagnosis not present

## 2024-04-30 DIAGNOSIS — I10 Essential (primary) hypertension: Secondary | ICD-10-CM | POA: Diagnosis not present

## 2024-04-30 DIAGNOSIS — Z604 Social exclusion and rejection: Secondary | ICD-10-CM | POA: Diagnosis not present

## 2024-04-30 DIAGNOSIS — I160 Hypertensive urgency: Secondary | ICD-10-CM | POA: Diagnosis not present

## 2024-04-30 DIAGNOSIS — I472 Ventricular tachycardia, unspecified: Secondary | ICD-10-CM | POA: Diagnosis not present

## 2024-05-05 DIAGNOSIS — E785 Hyperlipidemia, unspecified: Secondary | ICD-10-CM | POA: Diagnosis not present

## 2024-05-05 DIAGNOSIS — I10 Essential (primary) hypertension: Secondary | ICD-10-CM | POA: Diagnosis not present

## 2024-05-05 DIAGNOSIS — I472 Ventricular tachycardia, unspecified: Secondary | ICD-10-CM | POA: Diagnosis not present

## 2024-05-05 DIAGNOSIS — Z96651 Presence of right artificial knee joint: Secondary | ICD-10-CM | POA: Diagnosis not present

## 2024-05-05 DIAGNOSIS — Z96643 Presence of artificial hip joint, bilateral: Secondary | ICD-10-CM | POA: Diagnosis not present

## 2024-05-05 DIAGNOSIS — I160 Hypertensive urgency: Secondary | ICD-10-CM | POA: Diagnosis not present

## 2024-05-05 DIAGNOSIS — E440 Moderate protein-calorie malnutrition: Secondary | ICD-10-CM | POA: Diagnosis not present

## 2024-05-05 DIAGNOSIS — R600 Localized edema: Secondary | ICD-10-CM | POA: Diagnosis not present

## 2024-05-05 DIAGNOSIS — I169 Hypertensive crisis, unspecified: Secondary | ICD-10-CM | POA: Diagnosis not present

## 2024-05-05 DIAGNOSIS — Z556 Problems related to health literacy: Secondary | ICD-10-CM | POA: Diagnosis not present

## 2024-05-05 DIAGNOSIS — E8770 Fluid overload, unspecified: Secondary | ICD-10-CM | POA: Diagnosis not present

## 2024-05-05 DIAGNOSIS — Z604 Social exclusion and rejection: Secondary | ICD-10-CM | POA: Diagnosis not present

## 2024-05-05 DIAGNOSIS — L8990 Pressure ulcer of unspecified site, unspecified stage: Secondary | ICD-10-CM | POA: Diagnosis not present

## 2024-05-05 DIAGNOSIS — H3530 Unspecified macular degeneration: Secondary | ICD-10-CM | POA: Diagnosis not present

## 2024-05-05 DIAGNOSIS — E876 Hypokalemia: Secondary | ICD-10-CM | POA: Diagnosis not present

## 2024-05-07 DIAGNOSIS — Z96643 Presence of artificial hip joint, bilateral: Secondary | ICD-10-CM | POA: Diagnosis not present

## 2024-05-07 DIAGNOSIS — H3530 Unspecified macular degeneration: Secondary | ICD-10-CM | POA: Diagnosis not present

## 2024-05-07 DIAGNOSIS — I10 Essential (primary) hypertension: Secondary | ICD-10-CM | POA: Diagnosis not present

## 2024-05-07 DIAGNOSIS — E8770 Fluid overload, unspecified: Secondary | ICD-10-CM | POA: Diagnosis not present

## 2024-05-07 DIAGNOSIS — I472 Ventricular tachycardia, unspecified: Secondary | ICD-10-CM | POA: Diagnosis not present

## 2024-05-07 DIAGNOSIS — Z556 Problems related to health literacy: Secondary | ICD-10-CM | POA: Diagnosis not present

## 2024-05-07 DIAGNOSIS — I160 Hypertensive urgency: Secondary | ICD-10-CM | POA: Diagnosis not present

## 2024-05-07 DIAGNOSIS — I169 Hypertensive crisis, unspecified: Secondary | ICD-10-CM | POA: Diagnosis not present

## 2024-05-07 DIAGNOSIS — Z604 Social exclusion and rejection: Secondary | ICD-10-CM | POA: Diagnosis not present

## 2024-05-07 DIAGNOSIS — E785 Hyperlipidemia, unspecified: Secondary | ICD-10-CM | POA: Diagnosis not present

## 2024-05-07 DIAGNOSIS — E876 Hypokalemia: Secondary | ICD-10-CM | POA: Diagnosis not present

## 2024-05-07 DIAGNOSIS — Z96651 Presence of right artificial knee joint: Secondary | ICD-10-CM | POA: Diagnosis not present

## 2024-05-10 DIAGNOSIS — Z96651 Presence of right artificial knee joint: Secondary | ICD-10-CM | POA: Diagnosis not present

## 2024-05-10 DIAGNOSIS — I169 Hypertensive crisis, unspecified: Secondary | ICD-10-CM | POA: Diagnosis not present

## 2024-05-10 DIAGNOSIS — Z96643 Presence of artificial hip joint, bilateral: Secondary | ICD-10-CM | POA: Diagnosis not present

## 2024-05-10 DIAGNOSIS — E876 Hypokalemia: Secondary | ICD-10-CM | POA: Diagnosis not present

## 2024-05-10 DIAGNOSIS — E8770 Fluid overload, unspecified: Secondary | ICD-10-CM | POA: Diagnosis not present

## 2024-05-10 DIAGNOSIS — Z556 Problems related to health literacy: Secondary | ICD-10-CM | POA: Diagnosis not present

## 2024-05-10 DIAGNOSIS — H3530 Unspecified macular degeneration: Secondary | ICD-10-CM | POA: Diagnosis not present

## 2024-05-10 DIAGNOSIS — E785 Hyperlipidemia, unspecified: Secondary | ICD-10-CM | POA: Diagnosis not present

## 2024-05-10 DIAGNOSIS — I472 Ventricular tachycardia, unspecified: Secondary | ICD-10-CM | POA: Diagnosis not present

## 2024-05-10 DIAGNOSIS — I160 Hypertensive urgency: Secondary | ICD-10-CM | POA: Diagnosis not present

## 2024-05-10 DIAGNOSIS — I10 Essential (primary) hypertension: Secondary | ICD-10-CM | POA: Diagnosis not present

## 2024-05-10 DIAGNOSIS — Z604 Social exclusion and rejection: Secondary | ICD-10-CM | POA: Diagnosis not present

## 2024-05-12 DIAGNOSIS — I169 Hypertensive crisis, unspecified: Secondary | ICD-10-CM | POA: Diagnosis not present

## 2024-05-12 DIAGNOSIS — Z96651 Presence of right artificial knee joint: Secondary | ICD-10-CM | POA: Diagnosis not present

## 2024-05-12 DIAGNOSIS — H3530 Unspecified macular degeneration: Secondary | ICD-10-CM | POA: Diagnosis not present

## 2024-05-12 DIAGNOSIS — Z556 Problems related to health literacy: Secondary | ICD-10-CM | POA: Diagnosis not present

## 2024-05-12 DIAGNOSIS — Z96643 Presence of artificial hip joint, bilateral: Secondary | ICD-10-CM | POA: Diagnosis not present

## 2024-05-12 DIAGNOSIS — E876 Hypokalemia: Secondary | ICD-10-CM | POA: Diagnosis not present

## 2024-05-12 DIAGNOSIS — I160 Hypertensive urgency: Secondary | ICD-10-CM | POA: Diagnosis not present

## 2024-05-12 DIAGNOSIS — E785 Hyperlipidemia, unspecified: Secondary | ICD-10-CM | POA: Diagnosis not present

## 2024-05-12 DIAGNOSIS — I10 Essential (primary) hypertension: Secondary | ICD-10-CM | POA: Diagnosis not present

## 2024-05-12 DIAGNOSIS — I472 Ventricular tachycardia, unspecified: Secondary | ICD-10-CM | POA: Diagnosis not present

## 2024-05-12 DIAGNOSIS — E8770 Fluid overload, unspecified: Secondary | ICD-10-CM | POA: Diagnosis not present

## 2024-05-12 DIAGNOSIS — Z604 Social exclusion and rejection: Secondary | ICD-10-CM | POA: Diagnosis not present

## 2024-05-13 ENCOUNTER — Ambulatory Visit (INDEPENDENT_AMBULATORY_CARE_PROVIDER_SITE_OTHER): Payer: Medicare Other | Admitting: Otolaryngology

## 2024-05-14 DIAGNOSIS — I160 Hypertensive urgency: Secondary | ICD-10-CM | POA: Diagnosis not present

## 2024-05-14 DIAGNOSIS — Z556 Problems related to health literacy: Secondary | ICD-10-CM | POA: Diagnosis not present

## 2024-05-14 DIAGNOSIS — Z604 Social exclusion and rejection: Secondary | ICD-10-CM | POA: Diagnosis not present

## 2024-05-14 DIAGNOSIS — E785 Hyperlipidemia, unspecified: Secondary | ICD-10-CM | POA: Diagnosis not present

## 2024-05-14 DIAGNOSIS — Z96651 Presence of right artificial knee joint: Secondary | ICD-10-CM | POA: Diagnosis not present

## 2024-05-14 DIAGNOSIS — I10 Essential (primary) hypertension: Secondary | ICD-10-CM | POA: Diagnosis not present

## 2024-05-14 DIAGNOSIS — E8770 Fluid overload, unspecified: Secondary | ICD-10-CM | POA: Diagnosis not present

## 2024-05-14 DIAGNOSIS — I472 Ventricular tachycardia, unspecified: Secondary | ICD-10-CM | POA: Diagnosis not present

## 2024-05-14 DIAGNOSIS — E876 Hypokalemia: Secondary | ICD-10-CM | POA: Diagnosis not present

## 2024-05-14 DIAGNOSIS — H3530 Unspecified macular degeneration: Secondary | ICD-10-CM | POA: Diagnosis not present

## 2024-05-14 DIAGNOSIS — Z96643 Presence of artificial hip joint, bilateral: Secondary | ICD-10-CM | POA: Diagnosis not present

## 2024-05-14 DIAGNOSIS — I169 Hypertensive crisis, unspecified: Secondary | ICD-10-CM | POA: Diagnosis not present

## 2024-05-20 ENCOUNTER — Encounter (INDEPENDENT_AMBULATORY_CARE_PROVIDER_SITE_OTHER): Admitting: Ophthalmology

## 2024-05-20 DIAGNOSIS — H43813 Vitreous degeneration, bilateral: Secondary | ICD-10-CM | POA: Diagnosis not present

## 2024-05-20 DIAGNOSIS — H353112 Nonexudative age-related macular degeneration, right eye, intermediate dry stage: Secondary | ICD-10-CM

## 2024-05-20 DIAGNOSIS — H35033 Hypertensive retinopathy, bilateral: Secondary | ICD-10-CM | POA: Diagnosis not present

## 2024-05-20 DIAGNOSIS — I10 Essential (primary) hypertension: Secondary | ICD-10-CM

## 2024-05-20 DIAGNOSIS — H353221 Exudative age-related macular degeneration, left eye, with active choroidal neovascularization: Secondary | ICD-10-CM | POA: Diagnosis not present

## 2024-05-25 DIAGNOSIS — I10 Essential (primary) hypertension: Secondary | ICD-10-CM | POA: Diagnosis not present

## 2024-05-25 DIAGNOSIS — Z556 Problems related to health literacy: Secondary | ICD-10-CM | POA: Diagnosis not present

## 2024-05-25 DIAGNOSIS — I169 Hypertensive crisis, unspecified: Secondary | ICD-10-CM | POA: Diagnosis not present

## 2024-05-25 DIAGNOSIS — E785 Hyperlipidemia, unspecified: Secondary | ICD-10-CM | POA: Diagnosis not present

## 2024-05-25 DIAGNOSIS — I4729 Other ventricular tachycardia: Secondary | ICD-10-CM | POA: Diagnosis not present

## 2024-05-25 DIAGNOSIS — E876 Hypokalemia: Secondary | ICD-10-CM | POA: Diagnosis not present

## 2024-05-27 DIAGNOSIS — E876 Hypokalemia: Secondary | ICD-10-CM | POA: Diagnosis not present

## 2024-05-27 DIAGNOSIS — I4729 Other ventricular tachycardia: Secondary | ICD-10-CM | POA: Diagnosis not present

## 2024-05-27 DIAGNOSIS — I169 Hypertensive crisis, unspecified: Secondary | ICD-10-CM | POA: Diagnosis not present

## 2024-05-27 DIAGNOSIS — I10 Essential (primary) hypertension: Secondary | ICD-10-CM | POA: Diagnosis not present

## 2024-05-27 DIAGNOSIS — Z556 Problems related to health literacy: Secondary | ICD-10-CM | POA: Diagnosis not present

## 2024-05-27 DIAGNOSIS — E785 Hyperlipidemia, unspecified: Secondary | ICD-10-CM | POA: Diagnosis not present

## 2024-06-02 DIAGNOSIS — E876 Hypokalemia: Secondary | ICD-10-CM | POA: Diagnosis not present

## 2024-06-02 DIAGNOSIS — I169 Hypertensive crisis, unspecified: Secondary | ICD-10-CM | POA: Diagnosis not present

## 2024-06-02 DIAGNOSIS — I10 Essential (primary) hypertension: Secondary | ICD-10-CM | POA: Diagnosis not present

## 2024-06-02 DIAGNOSIS — Z556 Problems related to health literacy: Secondary | ICD-10-CM | POA: Diagnosis not present

## 2024-06-02 DIAGNOSIS — E785 Hyperlipidemia, unspecified: Secondary | ICD-10-CM | POA: Diagnosis not present

## 2024-06-02 DIAGNOSIS — I4729 Other ventricular tachycardia: Secondary | ICD-10-CM | POA: Diagnosis not present

## 2024-06-07 DIAGNOSIS — E785 Hyperlipidemia, unspecified: Secondary | ICD-10-CM | POA: Diagnosis not present

## 2024-06-07 DIAGNOSIS — M179 Osteoarthritis of knee, unspecified: Secondary | ICD-10-CM | POA: Diagnosis not present

## 2024-06-07 DIAGNOSIS — E7800 Pure hypercholesterolemia, unspecified: Secondary | ICD-10-CM | POA: Diagnosis not present

## 2024-06-07 DIAGNOSIS — N1831 Chronic kidney disease, stage 3a: Secondary | ICD-10-CM | POA: Diagnosis not present

## 2024-06-08 DIAGNOSIS — Z556 Problems related to health literacy: Secondary | ICD-10-CM | POA: Diagnosis not present

## 2024-06-08 DIAGNOSIS — E876 Hypokalemia: Secondary | ICD-10-CM | POA: Diagnosis not present

## 2024-06-08 DIAGNOSIS — I10 Essential (primary) hypertension: Secondary | ICD-10-CM | POA: Diagnosis not present

## 2024-06-08 DIAGNOSIS — I4729 Other ventricular tachycardia: Secondary | ICD-10-CM | POA: Diagnosis not present

## 2024-06-08 DIAGNOSIS — E785 Hyperlipidemia, unspecified: Secondary | ICD-10-CM | POA: Diagnosis not present

## 2024-06-08 DIAGNOSIS — I169 Hypertensive crisis, unspecified: Secondary | ICD-10-CM | POA: Diagnosis not present

## 2024-06-09 DIAGNOSIS — I4729 Other ventricular tachycardia: Secondary | ICD-10-CM | POA: Diagnosis not present

## 2024-06-09 DIAGNOSIS — E785 Hyperlipidemia, unspecified: Secondary | ICD-10-CM | POA: Diagnosis not present

## 2024-06-09 DIAGNOSIS — E876 Hypokalemia: Secondary | ICD-10-CM | POA: Diagnosis not present

## 2024-06-09 DIAGNOSIS — I10 Essential (primary) hypertension: Secondary | ICD-10-CM | POA: Diagnosis not present

## 2024-06-09 DIAGNOSIS — Z556 Problems related to health literacy: Secondary | ICD-10-CM | POA: Diagnosis not present

## 2024-06-09 DIAGNOSIS — I169 Hypertensive crisis, unspecified: Secondary | ICD-10-CM | POA: Diagnosis not present

## 2024-06-10 DIAGNOSIS — E876 Hypokalemia: Secondary | ICD-10-CM | POA: Diagnosis not present

## 2024-06-10 DIAGNOSIS — I4729 Other ventricular tachycardia: Secondary | ICD-10-CM | POA: Diagnosis not present

## 2024-06-10 DIAGNOSIS — Z556 Problems related to health literacy: Secondary | ICD-10-CM | POA: Diagnosis not present

## 2024-06-10 DIAGNOSIS — I10 Essential (primary) hypertension: Secondary | ICD-10-CM | POA: Diagnosis not present

## 2024-06-10 DIAGNOSIS — E785 Hyperlipidemia, unspecified: Secondary | ICD-10-CM | POA: Diagnosis not present

## 2024-06-10 DIAGNOSIS — I169 Hypertensive crisis, unspecified: Secondary | ICD-10-CM | POA: Diagnosis not present

## 2024-06-16 DIAGNOSIS — Z556 Problems related to health literacy: Secondary | ICD-10-CM | POA: Diagnosis not present

## 2024-06-16 DIAGNOSIS — I169 Hypertensive crisis, unspecified: Secondary | ICD-10-CM | POA: Diagnosis not present

## 2024-06-16 DIAGNOSIS — I10 Essential (primary) hypertension: Secondary | ICD-10-CM | POA: Diagnosis not present

## 2024-06-16 DIAGNOSIS — E785 Hyperlipidemia, unspecified: Secondary | ICD-10-CM | POA: Diagnosis not present

## 2024-06-16 DIAGNOSIS — E876 Hypokalemia: Secondary | ICD-10-CM | POA: Diagnosis not present

## 2024-06-16 DIAGNOSIS — I4729 Other ventricular tachycardia: Secondary | ICD-10-CM | POA: Diagnosis not present

## 2024-06-17 DIAGNOSIS — N3001 Acute cystitis with hematuria: Secondary | ICD-10-CM | POA: Diagnosis not present

## 2024-06-17 DIAGNOSIS — I10 Essential (primary) hypertension: Secondary | ICD-10-CM | POA: Diagnosis not present

## 2024-06-17 DIAGNOSIS — R631 Polydipsia: Secondary | ICD-10-CM | POA: Diagnosis not present

## 2024-06-17 DIAGNOSIS — E782 Mixed hyperlipidemia: Secondary | ICD-10-CM | POA: Diagnosis not present

## 2024-06-17 DIAGNOSIS — N390 Urinary tract infection, site not specified: Secondary | ICD-10-CM | POA: Diagnosis not present

## 2024-06-17 DIAGNOSIS — R8290 Unspecified abnormal findings in urine: Secondary | ICD-10-CM | POA: Diagnosis not present

## 2024-06-17 DIAGNOSIS — R300 Dysuria: Secondary | ICD-10-CM | POA: Diagnosis not present

## 2024-06-22 DIAGNOSIS — I10 Essential (primary) hypertension: Secondary | ICD-10-CM | POA: Diagnosis not present

## 2024-06-22 DIAGNOSIS — E876 Hypokalemia: Secondary | ICD-10-CM | POA: Diagnosis not present

## 2024-06-22 DIAGNOSIS — I4729 Other ventricular tachycardia: Secondary | ICD-10-CM | POA: Diagnosis not present

## 2024-06-22 DIAGNOSIS — I169 Hypertensive crisis, unspecified: Secondary | ICD-10-CM | POA: Diagnosis not present

## 2024-06-22 DIAGNOSIS — E785 Hyperlipidemia, unspecified: Secondary | ICD-10-CM | POA: Diagnosis not present

## 2024-06-22 DIAGNOSIS — Z556 Problems related to health literacy: Secondary | ICD-10-CM | POA: Diagnosis not present

## 2024-06-23 DIAGNOSIS — E876 Hypokalemia: Secondary | ICD-10-CM | POA: Diagnosis not present

## 2024-06-23 DIAGNOSIS — E785 Hyperlipidemia, unspecified: Secondary | ICD-10-CM | POA: Diagnosis not present

## 2024-06-23 DIAGNOSIS — I4729 Other ventricular tachycardia: Secondary | ICD-10-CM | POA: Diagnosis not present

## 2024-06-23 DIAGNOSIS — Z556 Problems related to health literacy: Secondary | ICD-10-CM | POA: Diagnosis not present

## 2024-06-23 DIAGNOSIS — I169 Hypertensive crisis, unspecified: Secondary | ICD-10-CM | POA: Diagnosis not present

## 2024-06-23 DIAGNOSIS — I10 Essential (primary) hypertension: Secondary | ICD-10-CM | POA: Diagnosis not present

## 2024-06-28 DIAGNOSIS — R631 Polydipsia: Secondary | ICD-10-CM | POA: Diagnosis not present

## 2024-06-28 DIAGNOSIS — E782 Mixed hyperlipidemia: Secondary | ICD-10-CM | POA: Diagnosis not present

## 2024-06-28 DIAGNOSIS — I10 Essential (primary) hypertension: Secondary | ICD-10-CM | POA: Diagnosis not present

## 2024-06-29 DIAGNOSIS — I10 Essential (primary) hypertension: Secondary | ICD-10-CM | POA: Diagnosis not present

## 2024-06-29 DIAGNOSIS — I169 Hypertensive crisis, unspecified: Secondary | ICD-10-CM | POA: Diagnosis not present

## 2024-06-29 DIAGNOSIS — E785 Hyperlipidemia, unspecified: Secondary | ICD-10-CM | POA: Diagnosis not present

## 2024-06-29 DIAGNOSIS — Z556 Problems related to health literacy: Secondary | ICD-10-CM | POA: Diagnosis not present

## 2024-06-29 DIAGNOSIS — I4729 Other ventricular tachycardia: Secondary | ICD-10-CM | POA: Diagnosis not present

## 2024-06-29 DIAGNOSIS — E876 Hypokalemia: Secondary | ICD-10-CM | POA: Diagnosis not present

## 2024-06-30 DIAGNOSIS — I4729 Other ventricular tachycardia: Secondary | ICD-10-CM | POA: Diagnosis not present

## 2024-06-30 DIAGNOSIS — I169 Hypertensive crisis, unspecified: Secondary | ICD-10-CM | POA: Diagnosis not present

## 2024-06-30 DIAGNOSIS — Z556 Problems related to health literacy: Secondary | ICD-10-CM | POA: Diagnosis not present

## 2024-06-30 DIAGNOSIS — E785 Hyperlipidemia, unspecified: Secondary | ICD-10-CM | POA: Diagnosis not present

## 2024-06-30 DIAGNOSIS — I10 Essential (primary) hypertension: Secondary | ICD-10-CM | POA: Diagnosis not present

## 2024-06-30 DIAGNOSIS — E876 Hypokalemia: Secondary | ICD-10-CM | POA: Diagnosis not present

## 2024-07-01 DIAGNOSIS — L22 Diaper dermatitis: Secondary | ICD-10-CM | POA: Diagnosis not present

## 2024-07-01 DIAGNOSIS — R8290 Unspecified abnormal findings in urine: Secondary | ICD-10-CM | POA: Diagnosis not present

## 2024-07-01 DIAGNOSIS — I10 Essential (primary) hypertension: Secondary | ICD-10-CM | POA: Diagnosis not present

## 2024-07-01 DIAGNOSIS — N390 Urinary tract infection, site not specified: Secondary | ICD-10-CM | POA: Diagnosis not present

## 2024-07-01 DIAGNOSIS — E782 Mixed hyperlipidemia: Secondary | ICD-10-CM | POA: Diagnosis not present

## 2024-07-01 DIAGNOSIS — R300 Dysuria: Secondary | ICD-10-CM | POA: Diagnosis not present

## 2024-07-01 DIAGNOSIS — R631 Polydipsia: Secondary | ICD-10-CM | POA: Diagnosis not present

## 2024-07-01 DIAGNOSIS — L853 Xerosis cutis: Secondary | ICD-10-CM | POA: Diagnosis not present

## 2024-07-01 DIAGNOSIS — N3001 Acute cystitis with hematuria: Secondary | ICD-10-CM | POA: Diagnosis not present

## 2024-07-07 DIAGNOSIS — E785 Hyperlipidemia, unspecified: Secondary | ICD-10-CM | POA: Diagnosis not present

## 2024-07-07 DIAGNOSIS — I169 Hypertensive crisis, unspecified: Secondary | ICD-10-CM | POA: Diagnosis not present

## 2024-07-07 DIAGNOSIS — Z556 Problems related to health literacy: Secondary | ICD-10-CM | POA: Diagnosis not present

## 2024-07-07 DIAGNOSIS — E876 Hypokalemia: Secondary | ICD-10-CM | POA: Diagnosis not present

## 2024-07-07 DIAGNOSIS — I4729 Other ventricular tachycardia: Secondary | ICD-10-CM | POA: Diagnosis not present

## 2024-07-07 DIAGNOSIS — I10 Essential (primary) hypertension: Secondary | ICD-10-CM | POA: Diagnosis not present

## 2024-07-08 DIAGNOSIS — M179 Osteoarthritis of knee, unspecified: Secondary | ICD-10-CM | POA: Diagnosis not present

## 2024-07-08 DIAGNOSIS — E785 Hyperlipidemia, unspecified: Secondary | ICD-10-CM | POA: Diagnosis not present

## 2024-07-08 DIAGNOSIS — N1831 Chronic kidney disease, stage 3a: Secondary | ICD-10-CM | POA: Diagnosis not present

## 2024-07-08 DIAGNOSIS — E7800 Pure hypercholesterolemia, unspecified: Secondary | ICD-10-CM | POA: Diagnosis not present

## 2024-07-12 DIAGNOSIS — E876 Hypokalemia: Secondary | ICD-10-CM | POA: Diagnosis not present

## 2024-07-12 DIAGNOSIS — E785 Hyperlipidemia, unspecified: Secondary | ICD-10-CM | POA: Diagnosis not present

## 2024-07-12 DIAGNOSIS — I4729 Other ventricular tachycardia: Secondary | ICD-10-CM | POA: Diagnosis not present

## 2024-07-12 DIAGNOSIS — Z556 Problems related to health literacy: Secondary | ICD-10-CM | POA: Diagnosis not present

## 2024-07-12 DIAGNOSIS — I169 Hypertensive crisis, unspecified: Secondary | ICD-10-CM | POA: Diagnosis not present

## 2024-07-12 DIAGNOSIS — I10 Essential (primary) hypertension: Secondary | ICD-10-CM | POA: Diagnosis not present

## 2024-07-14 DIAGNOSIS — E876 Hypokalemia: Secondary | ICD-10-CM | POA: Diagnosis not present

## 2024-07-14 DIAGNOSIS — Z556 Problems related to health literacy: Secondary | ICD-10-CM | POA: Diagnosis not present

## 2024-07-14 DIAGNOSIS — I10 Essential (primary) hypertension: Secondary | ICD-10-CM | POA: Diagnosis not present

## 2024-07-14 DIAGNOSIS — I4729 Other ventricular tachycardia: Secondary | ICD-10-CM | POA: Diagnosis not present

## 2024-07-14 DIAGNOSIS — E785 Hyperlipidemia, unspecified: Secondary | ICD-10-CM | POA: Diagnosis not present

## 2024-07-14 DIAGNOSIS — I169 Hypertensive crisis, unspecified: Secondary | ICD-10-CM | POA: Diagnosis not present

## 2024-07-15 ENCOUNTER — Encounter (INDEPENDENT_AMBULATORY_CARE_PROVIDER_SITE_OTHER): Admitting: Ophthalmology

## 2024-07-15 DIAGNOSIS — H353221 Exudative age-related macular degeneration, left eye, with active choroidal neovascularization: Secondary | ICD-10-CM

## 2024-07-15 DIAGNOSIS — H353112 Nonexudative age-related macular degeneration, right eye, intermediate dry stage: Secondary | ICD-10-CM

## 2024-07-15 DIAGNOSIS — H43813 Vitreous degeneration, bilateral: Secondary | ICD-10-CM

## 2024-07-15 DIAGNOSIS — I10 Essential (primary) hypertension: Secondary | ICD-10-CM

## 2024-07-15 DIAGNOSIS — H35033 Hypertensive retinopathy, bilateral: Secondary | ICD-10-CM | POA: Diagnosis not present

## 2024-07-21 DIAGNOSIS — E785 Hyperlipidemia, unspecified: Secondary | ICD-10-CM | POA: Diagnosis not present

## 2024-07-21 DIAGNOSIS — I4729 Other ventricular tachycardia: Secondary | ICD-10-CM | POA: Diagnosis not present

## 2024-07-21 DIAGNOSIS — E876 Hypokalemia: Secondary | ICD-10-CM | POA: Diagnosis not present

## 2024-07-21 DIAGNOSIS — I169 Hypertensive crisis, unspecified: Secondary | ICD-10-CM | POA: Diagnosis not present

## 2024-07-21 DIAGNOSIS — I10 Essential (primary) hypertension: Secondary | ICD-10-CM | POA: Diagnosis not present

## 2024-07-21 DIAGNOSIS — Z556 Problems related to health literacy: Secondary | ICD-10-CM | POA: Diagnosis not present

## 2024-07-23 DIAGNOSIS — I10 Essential (primary) hypertension: Secondary | ICD-10-CM | POA: Diagnosis not present

## 2024-07-23 DIAGNOSIS — Z79899 Other long term (current) drug therapy: Secondary | ICD-10-CM | POA: Diagnosis not present

## 2024-07-23 DIAGNOSIS — L89301 Pressure ulcer of unspecified buttock, stage 1: Secondary | ICD-10-CM | POA: Diagnosis not present

## 2024-07-23 DIAGNOSIS — M159 Polyosteoarthritis, unspecified: Secondary | ICD-10-CM | POA: Diagnosis not present

## 2024-07-23 DIAGNOSIS — M170 Bilateral primary osteoarthritis of knee: Secondary | ICD-10-CM | POA: Diagnosis not present

## 2024-07-23 DIAGNOSIS — L89311 Pressure ulcer of right buttock, stage 1: Secondary | ICD-10-CM | POA: Diagnosis not present

## 2024-07-23 DIAGNOSIS — L22 Diaper dermatitis: Secondary | ICD-10-CM | POA: Diagnosis not present

## 2024-08-08 DIAGNOSIS — E785 Hyperlipidemia, unspecified: Secondary | ICD-10-CM | POA: Diagnosis not present

## 2024-08-08 DIAGNOSIS — E7800 Pure hypercholesterolemia, unspecified: Secondary | ICD-10-CM | POA: Diagnosis not present

## 2024-08-08 DIAGNOSIS — M179 Osteoarthritis of knee, unspecified: Secondary | ICD-10-CM | POA: Diagnosis not present

## 2024-08-08 DIAGNOSIS — N1831 Chronic kidney disease, stage 3a: Secondary | ICD-10-CM | POA: Diagnosis not present

## 2024-09-02 DIAGNOSIS — R413 Other amnesia: Secondary | ICD-10-CM | POA: Diagnosis not present

## 2024-09-07 DIAGNOSIS — E785 Hyperlipidemia, unspecified: Secondary | ICD-10-CM | POA: Diagnosis not present

## 2024-09-07 DIAGNOSIS — E7800 Pure hypercholesterolemia, unspecified: Secondary | ICD-10-CM | POA: Diagnosis not present

## 2024-09-07 DIAGNOSIS — N1831 Chronic kidney disease, stage 3a: Secondary | ICD-10-CM | POA: Diagnosis not present

## 2024-09-07 DIAGNOSIS — M179 Osteoarthritis of knee, unspecified: Secondary | ICD-10-CM | POA: Diagnosis not present

## 2024-09-16 ENCOUNTER — Encounter (INDEPENDENT_AMBULATORY_CARE_PROVIDER_SITE_OTHER): Admitting: Ophthalmology

## 2024-09-16 DIAGNOSIS — H43813 Vitreous degeneration, bilateral: Secondary | ICD-10-CM

## 2024-09-16 DIAGNOSIS — H353112 Nonexudative age-related macular degeneration, right eye, intermediate dry stage: Secondary | ICD-10-CM

## 2024-09-16 DIAGNOSIS — I10 Essential (primary) hypertension: Secondary | ICD-10-CM

## 2024-09-16 DIAGNOSIS — H35033 Hypertensive retinopathy, bilateral: Secondary | ICD-10-CM

## 2024-09-16 DIAGNOSIS — H353221 Exudative age-related macular degeneration, left eye, with active choroidal neovascularization: Secondary | ICD-10-CM

## 2024-11-25 ENCOUNTER — Encounter (INDEPENDENT_AMBULATORY_CARE_PROVIDER_SITE_OTHER): Admitting: Ophthalmology

## 2024-11-26 ENCOUNTER — Encounter (INDEPENDENT_AMBULATORY_CARE_PROVIDER_SITE_OTHER): Admitting: Ophthalmology

## 2024-11-26 DIAGNOSIS — I10 Essential (primary) hypertension: Secondary | ICD-10-CM | POA: Diagnosis not present

## 2024-11-26 DIAGNOSIS — H353221 Exudative age-related macular degeneration, left eye, with active choroidal neovascularization: Secondary | ICD-10-CM

## 2024-11-26 DIAGNOSIS — H35033 Hypertensive retinopathy, bilateral: Secondary | ICD-10-CM

## 2024-11-26 DIAGNOSIS — H353112 Nonexudative age-related macular degeneration, right eye, intermediate dry stage: Secondary | ICD-10-CM

## 2024-11-26 DIAGNOSIS — H43813 Vitreous degeneration, bilateral: Secondary | ICD-10-CM

## 2025-02-18 ENCOUNTER — Encounter (INDEPENDENT_AMBULATORY_CARE_PROVIDER_SITE_OTHER): Admitting: Ophthalmology
